# Patient Record
Sex: Female | Born: 1955 | Race: White | Hispanic: No | Marital: Married | Smoking: Never smoker
Health system: Southern US, Community
[De-identification: ages and names within clinical notes are randomized; demographics above are authoritative.]

## PROBLEM LIST (undated history)

## (undated) DIAGNOSIS — I82409 Acute embolism and thrombosis of unspecified deep veins of unspecified lower extremity: Secondary | ICD-10-CM

## (undated) DIAGNOSIS — M1990 Unspecified osteoarthritis, unspecified site: Secondary | ICD-10-CM

## (undated) HISTORY — DX: Acute embolism and thrombosis of unspecified deep veins of unspecified lower extremity: I82.409

## (undated) HISTORY — PX: ABDOMINAL HYSTERECTOMY: SHX81

## (undated) HISTORY — PX: PATENT DUCTUS ARTERIOUS REPAIR: SHX269

## (undated) HISTORY — PX: BREAST EXCISIONAL BIOPSY: SUR124

---
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
## 2016-03-28 DIAGNOSIS — R030 Elevated blood-pressure reading, without diagnosis of hypertension: Secondary | ICD-10-CM | POA: Insufficient documentation

## 2016-07-10 DIAGNOSIS — Z8639 Personal history of other endocrine, nutritional and metabolic disease: Secondary | ICD-10-CM | POA: Insufficient documentation

## 2017-08-08 DIAGNOSIS — C4491 Basal cell carcinoma of skin, unspecified: Secondary | ICD-10-CM | POA: Insufficient documentation

## 2017-08-08 DIAGNOSIS — C4492 Squamous cell carcinoma of skin, unspecified: Secondary | ICD-10-CM | POA: Insufficient documentation

## 2017-11-19 DIAGNOSIS — N951 Menopausal and female climacteric states: Secondary | ICD-10-CM | POA: Insufficient documentation

## 2019-05-25 DIAGNOSIS — K635 Polyp of colon: Secondary | ICD-10-CM | POA: Insufficient documentation

## 2021-05-18 DIAGNOSIS — Z0000 Encounter for general adult medical examination without abnormal findings: Secondary | ICD-10-CM | POA: Insufficient documentation

## 2021-07-07 ENCOUNTER — Ambulatory Visit: Payer: Medicare HMO | Attending: Orthopedic Surgery | Admitting: Occupational Therapy

## 2021-07-07 ENCOUNTER — Other Ambulatory Visit: Payer: Self-pay

## 2021-07-07 DIAGNOSIS — M25631 Stiffness of right wrist, not elsewhere classified: Secondary | ICD-10-CM | POA: Diagnosis present

## 2021-07-07 DIAGNOSIS — M25531 Pain in right wrist: Secondary | ICD-10-CM | POA: Diagnosis present

## 2021-07-07 DIAGNOSIS — M654 Radial styloid tenosynovitis [de Quervain]: Secondary | ICD-10-CM | POA: Diagnosis not present

## 2021-07-07 NOTE — Therapy (Signed)
[place] [facility] [address] [place], [address] Phone: [phone]   Fax:  [phone]  Occupational Therapy Evaluation  Patient Details  Name: Gail Roberts MRN: [id number] Date of Birth: 1956/07/03 Referring Provider (OT): Tamala Julian   Encounter Date: 07/07/2021   OT End of Session - 07/07/21 1035     Visit Number 1    Number of Visits 12    Date for OT Re-Evaluation 08/18/21    OT Start Time 0833    OT Stop Time 0910    OT Time Calculation (min) 37 min    Activity Tolerance Patient tolerated treatment well    Behavior During Therapy [facility] for tasks assessed/performed             No past medical history on file.   There were no vitals filed for this visit.   Subjective Assessment - 07/07/21 1012     Subjective  It got the worse the last year again - when my husband had TKR and I tied his shoes and caught pushed down on my thumb - ibruprofen and splint helps - but temporarily - it is good day today - splint started wearing 75% of time since last week    Pertinent History Seen by PA at [facility] - on 06/27/21- Gail Roberts is a 65 y.o. female that presents to clinic today for initial evaluation and management of right wrist pain.     Her pain began around a year ago. She states she did slip forward on her hands when rising after tieing her husband's shoe. The pain is located primarily over the radial aspect of the wrist, though she states that it sometimes feels "like there is a band" around the wrist. She describes her pain as achy. It is aggravated by ulnar deviation. She has minimal pain at rest. She reports associated She denies associated numbness, tingling, weakness in the hands. She has tried Ibuprofen and aspirin with significant but temporary relief of her symptoms.    REfer to OT    Patient Stated Goals Want the pain better that I can do things without pain and my husband not to  help me so much    Currently in Pain? Yes    Pain Score 3     Pain Location Wrist    Pain Orientation Right    Pain Descriptors / Indicators Aching;Tender;Tightness    Pain Type Acute pain;Chronic pain    Pain Onset More than a month ago    Pain Frequency Intermittent               [facility] OT Assessment - 07/07/21 0001       Assessment   Medical Diagnosis R DeQuervain    Referring Provider (OT) Tamala Julian    Onset Date/Surgical Date 10/08/20    Hand Dominance Right      Home  Environment   Lives With Spouse      Prior Function   Vocation Retired    Teacher, music, on [employer],      AROM   Right Wrist Extension 65 Degrees    Right Wrist Flexion 85 Degrees    Right Wrist Radial Deviation 15 Degrees    Right Wrist Ulnar Deviation 40 Degrees      Strength   Right Hand Grip (lbs) 62    Right Hand Lateral Pinch 19 lbs    Right Hand 3 Point Pinch 17 lbs  Left Hand Grip (lbs) 62    Left Hand Lateral Pinch 17 lbs    Left Hand 3 Point Pinch 13 lbs      Right Hand AROM   R Thumb Opposition to Index --   Opposition to base of 5th                             OT Education - 07/07/21 1035     Education Details Findings of eval and HEP    Person(s) Educated Patient    Methods Explanation;Demonstration;Tactile cues;Verbal cues    Comprehension Verbal cues required;Returned demonstration;Verbalized understanding                 OT Long Term Goals - 07/07/21 1235       OT LONG TERM GOAL #1   Title Pt's R wrist and thumb AROM increase to WNL without increase symptoms to wean out of splint    Baseline pain and tender 2/10 , pain with UD, thumb RA and end range flexion, ext decrease    Time 4    Period Weeks    Status New    Target Date 08/03/21      OT LONG TERM GOAL #2   Title Pt to be independent in HEP including know what activities and tasks to modify  to decrease pain and prevent agrivation of symptoms    Baseline no  knowledge - cook, computer , grandkids, crochet and sewing    Time 6    Period Weeks    Status New    Target Date 08/18/21                   Plan - 07/07/21 1038     Clinical Impression Statement Pt present at OT eval with diagnosis of R DeQuervain tenosynovitis- long standing on and off for longer than year  - pain and tenderness about 2/10 over distal radius and FInkelstein - did decrease the last week since wearing thumb spica splint and taking ibuprofen- pain with UD and thumb RA -  pt grip and prehension strength WNL for her age -ed pt on HEP and modalities as well as joint protection and modifications to tasks doing her ADL's and IADL's    OT Occupational Profile and History Problem Focused Assessment - Including review of records relating to presenting problem    Occupational performance deficits (Please refer to evaluation for details): ADL's;IADL's;Play;Leisure;Social Participation    Body Structure / Function / Physical Skills ADL;Decreased knowledge of use of DME;Strength;Pain;UE functional use;IADL;ROM;Flexibility;Decreased knowledge of precautions    Rehab Potential Good    Clinical Decision Making Limited treatment options, no task modification necessary    Comorbidities Affecting Occupational Performance: None    Modification or Assistance to Complete Evaluation  No modification of tasks or assist necessary to complete eval    OT Frequency 2x / week    OT Duration 6 weeks    OT Treatment/Interventions Self-care/ADL training;Iontophoresis;Fluidtherapy;Contrast Bath;Therapeutic exercise;Manual Therapy;Patient/family education;Passive range of motion;DME and/or AE instruction;Splinting    Consulted and Agree with Plan of Care Patient             Patient will benefit from skilled therapeutic intervention in order to improve the following deficits and impairments:   Body Structure / Function / Physical Skills: ADL, Decreased knowledge of use of DME, Strength, Pain, UE  functional use, IADL, ROM, Flexibility, Decreased knowledge of precautions  Visit Diagnosis: Tenosynovitis, de Quervain - Plan: Ot plan of care cert/re-cert  Pain in right wrist - Plan: Ot plan of care cert/re-cert  Stiffness of right wrist, not elsewhere classified - Plan: Ot plan of care cert/re-cert    Problem List There are no problems to display for this patient.   Rosalyn Gess, OTR/L,CLT 07/07/2021, 12:39 PM  [place] [facility] [address], [place], [address] Phone: [phone]   Fax:  [phone]  Name: Tirsa Gail MRN: [id number] Date of Birth: [birth year]

## 2021-07-18 ENCOUNTER — Ambulatory Visit: Payer: Medicare HMO | Attending: Orthopedic Surgery | Admitting: Occupational Therapy

## 2021-07-18 DIAGNOSIS — M25631 Stiffness of right wrist, not elsewhere classified: Secondary | ICD-10-CM | POA: Insufficient documentation

## 2021-07-18 DIAGNOSIS — M654 Radial styloid tenosynovitis [de Quervain]: Secondary | ICD-10-CM | POA: Diagnosis not present

## 2021-07-18 DIAGNOSIS — M25531 Pain in right wrist: Secondary | ICD-10-CM | POA: Insufficient documentation

## 2021-07-18 NOTE — Therapy (Signed)
[place] [facility] [address], [place], [address] Phone: [phone]   Fax:  [phone]  Occupational Therapy Treatment  Patient Details  Name: Gail Roberts MRN: [id number] Date of Birth: Aug 21, 1956 Referring Provider (OT): Tamala Julian   Encounter Date: 07/18/2021   OT End of Session - 07/18/21 1106     Visit Number 2    Number of Visits 12    Date for OT Re-Evaluation 08/18/21    OT Start Time 9323    OT Stop Time 1055    OT Time Calculation (min) 16 min    Activity Tolerance Patient tolerated treatment well    Behavior During Therapy [facility] for tasks assessed/performed             No past medical history on file.    There were no vitals filed for this visit.   Subjective Assessment - 07/18/21 1104     Subjective  Doing very well - did my exercises like you told me and wear the splint most all the time until the other day took if off 1/2 the day and yesterday most of the day off-and pain better - and motion -and I am changing the way I grip and push/ pull objects -    Pertinent History Seen by PA at [facility] - on 06/27/21- Gail Roberts is a 65 y.o. female that presents to clinic today for initial evaluation and management of right wrist pain.     Her pain began around a year ago. She states she did slip forward on her hands when rising after tieing her husband's shoe. The pain is located primarily over the radial aspect of the wrist, though she states that it sometimes feels "like there is a band" around the wrist. She describes her pain as achy. It is aggravated by ulnar deviation. She has minimal pain at rest. She reports associated She denies associated numbness, tingling, weakness in the hands. She has tried Ibuprofen and aspirin with significant but temporary relief of her symptoms.    REfer to OT    Patient Stated Goals Want the pain better that I can do things without pain and my  husband not to help me so much    Currently in Pain? No/denies                [facility] OT Assessment - 07/18/21 0001       AROM   Right Wrist Extension 70 Degrees    Right Wrist Flexion 85 Degrees      Strength   Right Hand Grip (lbs) 67    Left Hand Grip (lbs) 62                Pt progress very well - AROM WNL and no pain - or with resistance- 5/5 in all planes  Same with thumb AROM in all planes - no pain  Increase grip  No pain with Finkelstein and Tenderness over distal radius head  Pt to wean out of splint gradually and cont with modification with tasks that cause pain or symptoms              OT Education - 07/18/21 1105     Education Details progress, HEP and modifications to cont with    Person(s) Educated Patient    Methods Explanation;Demonstration;Tactile cues;Verbal cues    Comprehension Verbal cues required;Returned demonstration;Verbalized understanding  OT Long Term Goals - 07/18/21 1147       OT LONG TERM GOAL #1   Title Pt's R wrist and thumb AROM increase to WNL without increase symptoms to wean out of splint    Baseline pain and tender 2/10 , pain with UD, thumb RA and end range flexion, ext decrease - NOW no pain and no symptoms - wean out of splint    Status Achieved      OT LONG TERM GOAL #2   Title Pt to be independent in HEP including know what activities and tasks to modify  to decrease pain and prevent agrivation of symptoms    Baseline no knowledge - cook, computer , grandkids, crochet and sewing  - NOW k ow  knowledge on modifictaions to prevent symptoms    Status Achieved                   Plan - 07/18/21 1106     Clinical Impression Statement Pt present at OT eval with diagnosis of R DeQuervain tenosynovitis- long standing on and off for longer than year  - pain and tenderness about 2/10 over distal radius and FInkelstein - did decrease the last week since wearing thumb spica splint and  taking ibuprofen- pain with UD and thumb RA -  pt grip and prehension strength WNL for her age - She was ed on HEP , and malities as well as joint protection and modifications to tasks doing her ADL's and IADL's- pt arrvie this date with no pain , negative Finkelstein and no pain with wrist and thumb AROM and strength end range- grip increase to R 67lbs ,and L 62 - pt report paying attention how she do things and can wean gradually out of splint and cont wiht ROM as needed- pt can cont at home with homeprogram    OT Occupational Profile and History Problem Focused Assessment - Including review of records relating to presenting problem    Occupational performance deficits (Please refer to evaluation for details): ADL's;IADL's;Play;Leisure;Social Participation    Body Structure / Function / Physical Skills ADL;Decreased knowledge of use of DME;Strength;Pain;UE functional use;IADL;ROM;Flexibility;Decreased knowledge of precautions    Rehab Potential Good    Clinical Decision Making Limited treatment options, no task modification necessary    Comorbidities Affecting Occupational Performance: None    Modification or Assistance to Complete Evaluation  No modification of tasks or assist necessary to complete eval    OT Frequency --   as need follow up   OT Duration 4 weeks    OT Treatment/Interventions Self-care/ADL training;Iontophoresis;Fluidtherapy;Contrast Bath;Therapeutic exercise;Manual Therapy;Patient/family education;Passive range of motion;DME and/or AE instruction;Splinting    Consulted and Agree with Plan of Care Patient             Patient will benefit from skilled therapeutic intervention in order to improve the following deficits and impairments:   Body Structure / Function / Physical Skills: ADL, Decreased knowledge of use of DME, Strength, Pain, UE functional use, IADL, ROM, Flexibility, Decreased knowledge of precautions       Visit Diagnosis: Tenosynovitis, de Quervain  Pain in  right wrist  Stiffness of right wrist, not elsewhere classified    Problem List There are no problems to display for this patient.   Gail Roberts, OTR/L,CLT 07/18/2021, 11:49 AM  [place] [facility] [address], [place], [address] Phone: [phone]   Fax:  [phone]  Name: Gail Roberts MRN: [id number]  Date of Birth: 1956/10/01

## 2022-01-09 ENCOUNTER — Encounter: Payer: Self-pay | Admitting: Otolaryngology

## 2022-02-07 ENCOUNTER — Other Ambulatory Visit: Payer: Self-pay | Admitting: Podiatry

## 2022-02-08 ENCOUNTER — Encounter: Payer: Self-pay | Admitting: Podiatry

## 2022-02-08 ENCOUNTER — Other Ambulatory Visit: Payer: Self-pay

## 2022-02-08 NOTE — Discharge Instructions (Addendum)
[place] ?[place] ? ?POST OPERATIVE INSTRUCTIONS FOR DR. Vickki Muff AND DR. Destany Severns ?[place] ? ? ?Take your medication as prescribed.  Pain medication should be taken only as needed.  You may also supplement pain medication with Tylenol as needed. ? ?Keep the dressing clean, dry and intact.  Please for the most part keep the boot on at all times as it will be acting as a cast or splint to support your foot. ? ?Keep your foot elevated above the heart level for the first 48 hours.  Continue elevation thereafter to improve swelling.  You may also use ice to the top of your right foot for maximum 10 minutes out of every 1 hour as needed. ? ?Recommend staying for the most part nonweightbearing to the right lower extremity, use crutches, knee scooter, or wheelchair to get around.  You may use your heel for contact when standing but do not put pressure on the ball of your foot. ? ?Do not take a shower. Baths are permissible as long as the foot is kept out of the water.  ? ?Every hour you are awake:  ?Bend your knee 15 times. ?Massage calf 15 times ? ?Call [facility] [phone]) if any of the following problems occur: ?You develop a temperature or fever. ?The bandage becomes saturated with blood. ?Medication does not stop your pain. ?Injury of the foot occurs. ?Any symptoms of infection including redness, odor, or red streaks running from wound.  ?

## 2022-02-15 ENCOUNTER — Encounter
Admission: RE | Disposition: A | Discharge status: Home / Self Care | Payer: Self-pay | Source: Home / Self Care | Attending: Podiatry

## 2022-02-15 ENCOUNTER — Ambulatory Visit
Admission: RE | Admit: 2022-02-15 | Discharge: 2022-02-15 | Disposition: A | Discharge status: Home / Self Care | Payer: Medicare HMO | Attending: Podiatry | Admitting: Podiatry

## 2022-02-15 ENCOUNTER — Ambulatory Visit: Payer: Medicare HMO | Admitting: Anesthesiology

## 2022-02-15 ENCOUNTER — Encounter: Payer: Self-pay | Admitting: Podiatry

## 2022-02-15 ENCOUNTER — Ambulatory Visit: Payer: Self-pay

## 2022-02-15 ENCOUNTER — Other Ambulatory Visit: Payer: Self-pay

## 2022-02-15 DIAGNOSIS — M2021 Hallux rigidus, right foot: Secondary | ICD-10-CM | POA: Diagnosis present

## 2022-02-15 HISTORY — PX: ARTHRODESIS METATARSALPHALANGEAL JOINT (MTPJ): SHX6566

## 2022-02-15 HISTORY — DX: Unspecified osteoarthritis, unspecified site: M19.90

## 2022-02-15 SURGERY — FUSION, JOINT, GREAT TOE
Anesthesia: General | Site: Foot | Laterality: Right

## 2022-02-15 MED ORDER — AMOXICILLIN-POT CLAVULANATE 875-125 MG PO TABS: 1.0000 | ORAL_TABLET | Freq: Two times a day (BID) | ORAL | 0 refills | Status: AC

## 2022-02-15 MED ORDER — CEFAZOLIN SODIUM-DEXTROSE 2-4 GM/100ML-% IV SOLN
2.0000 g | INTRAVENOUS | Status: AC
  Administered 2022-02-15: 2 g via INTRAVENOUS

## 2022-02-15 MED ORDER — HYDROCODONE-ACETAMINOPHEN 7.5-325 MG PO TABS: 1.0000 | ORAL_TABLET | Freq: Four times a day (QID) | ORAL | 0 refills | Status: AC | PRN

## 2022-02-15 MED ORDER — PROPOFOL 500 MG/50ML IV EMUL
INTRAVENOUS | Status: DC | PRN
  Administered 2022-02-15: 100 ug/kg/min via INTRAVENOUS

## 2022-02-15 MED ORDER — GLYCOPYRROLATE 0.2 MG/ML IJ SOLN
INTRAMUSCULAR | Status: DC | PRN
Start: 2022-02-15 — End: 2022-02-15
  Administered 2022-02-15 (×2): .1 mg via INTRAVENOUS

## 2022-02-15 MED ORDER — LIDOCAINE HCL (CARDIAC) PF 100 MG/5ML IV SOSY
PREFILLED_SYRINGE | INTRAVENOUS | Status: DC | PRN
  Administered 2022-02-15: 40 mg via INTRAVENOUS

## 2022-02-15 MED ORDER — 0.9 % SODIUM CHLORIDE (POUR BTL) OPTIME
TOPICAL | Status: DC | PRN
  Administered 2022-02-15: 500 mL

## 2022-02-15 MED ORDER — BUPIVACAINE HCL (PF) 0.5 % IJ SOLN
INTRAMUSCULAR | Status: DC | PRN
  Administered 2022-02-15: 100 mg

## 2022-02-15 MED ORDER — FENTANYL CITRATE (PF) 100 MCG/2ML IJ SOLN
INTRAMUSCULAR | Status: DC | PRN
  Administered 2022-02-15: 100 ug via INTRAVENOUS

## 2022-02-15 MED ORDER — LACTATED RINGERS IV SOLN: INTRAVENOUS | Status: DC

## 2022-02-15 MED ORDER — BUPIVACAINE LIPOSOME 1.3 % IJ SUSP
INTRAMUSCULAR | Status: DC | PRN
  Administered 2022-02-15: 20 mL

## 2022-02-15 MED ORDER — EPHEDRINE SULFATE (PRESSORS) 50 MG/ML IJ SOLN
INTRAMUSCULAR | Status: DC | PRN
  Administered 2022-02-15: 10 mg via INTRAVENOUS
  Administered 2022-02-15 (×2): 5 mg via INTRAVENOUS

## 2022-02-15 MED ORDER — MIDAZOLAM HCL 2 MG/2ML IJ SOLN
INTRAMUSCULAR | Status: DC | PRN
  Administered 2022-02-15: 2 mg via INTRAVENOUS

## 2022-02-15 MED ORDER — PHENYLEPHRINE HCL (PRESSORS) 10 MG/ML IV SOLN
INTRAVENOUS | Status: DC | PRN
Start: 2022-02-15 — End: 2022-02-15
  Administered 2022-02-15 (×2): 100 ug via INTRAVENOUS

## 2022-02-15 MED ORDER — ONDANSETRON HCL 4 MG/2ML IJ SOLN
INTRAMUSCULAR | Status: DC | PRN
Start: 2022-02-15 — End: 2022-02-15
  Administered 2022-02-15: 4 mg via INTRAVENOUS

## 2022-02-15 MED ORDER — ASPIRIN EC 81 MG PO TBEC: 81.0000 mg | DELAYED_RELEASE_TABLET | Freq: Two times a day (BID) | ORAL | 0 refills | Status: DC

## 2022-02-15 MED ORDER — OXYCODONE HCL 5 MG PO TABS: 5.0000 mg | ORAL_TABLET | Freq: Once | ORAL | Status: DC | PRN

## 2022-02-15 MED ORDER — OXYCODONE HCL 5 MG/5ML PO SOLN: 5.0000 mg | Freq: Once | ORAL | Status: DC | PRN

## 2022-02-15 MED ORDER — DEXAMETHASONE SODIUM PHOSPHATE 4 MG/ML IJ SOLN
INTRAMUSCULAR | Status: DC | PRN
  Administered 2022-02-15: 4 mg via INTRAVENOUS

## 2022-02-15 MED ORDER — FENTANYL CITRATE PF 50 MCG/ML IJ SOSY: 25.0000 ug | PREFILLED_SYRINGE | INTRAMUSCULAR | Status: DC | PRN

## 2022-02-15 SURGICAL SUPPLY — 59 items
BIT DRILL 2 FENESTRATED (MISCELLANEOUS) IMPLANT
BIT DRILL CANNULTD 2.6 X 130MM (DRILL) IMPLANT
BIT DRILL SOLID 2.0 X 110MM (DRILL) IMPLANT
BIT DRILLL 2 FENESTRATED (MISCELLANEOUS) ×1
BLADE OSC/SAGITTAL MD 9X18.5 (BLADE) ×1 IMPLANT
BNDG CMPR STD VLCR NS LF 5.8X4 (GAUZE/BANDAGES/DRESSINGS) ×1
BNDG CMPR STD VLCR NS LF 5.8X6 (GAUZE/BANDAGES/DRESSINGS) ×1
BNDG COHESIVE 4X5 TAN ST LF (GAUZE/BANDAGES/DRESSINGS) ×2 IMPLANT
BNDG ELASTIC 4X5.8 VLCR NS LF (GAUZE/BANDAGES/DRESSINGS) ×2 IMPLANT
BNDG ELASTIC 6X5.8 VLCR NS LF (GAUZE/BANDAGES/DRESSINGS) ×2 IMPLANT
BNDG ESMARK 4X12 TAN STRL LF (GAUZE/BANDAGES/DRESSINGS) ×2 IMPLANT
BNDG GAUZE ELAST 4 BULKY (GAUZE/BANDAGES/DRESSINGS) ×2 IMPLANT
BOOT STEPPER DURA LG (SOFTGOODS) ×1 IMPLANT
CANISTER SUCT 1200ML W/VALVE (MISCELLANEOUS) ×2 IMPLANT
COUNTERSICK 4.0 HEADED (MISCELLANEOUS) ×2
COVER LIGHT HANDLE UNIVERSAL (MISCELLANEOUS) ×4 IMPLANT
CUFF TOURN SGL QUICK 18X4 (TOURNIQUET CUFF) ×1 IMPLANT
DRAPE FLUOR MINI C-ARM 54X84 (DRAPES) ×2 IMPLANT
DRILL CANNULATED 2.6 X 130MM (DRILL) ×2
DRILL SOLID 2.0 X 110MM (DRILL) ×2
DURAPREP 26ML APPLICATOR (WOUND CARE) ×2 IMPLANT
ELECT REM PT RETURN 9FT ADLT (ELECTROSURGICAL) ×2
ELECTRODE REM PT RTRN 9FT ADLT (ELECTROSURGICAL) ×1 IMPLANT
GAUZE SPONGE 4X4 12PLY STRL (GAUZE/BANDAGES/DRESSINGS) ×2 IMPLANT
GAUZE XEROFORM 1X8 LF (GAUZE/BANDAGES/DRESSINGS) ×2 IMPLANT
GLOVE SURG POLYISO LF SZ7 (GLOVE) ×4 IMPLANT
GLOVE SURG UNDER POLY LF SZ7 (GLOVE) ×2 IMPLANT
GOWN STRL REUS W/ TWL LRG LVL3 (GOWN DISPOSABLE) ×2 IMPLANT
GOWN STRL REUS W/TWL LRG LVL3 (GOWN DISPOSABLE) ×4
K-WIRE SMOOTH 1.6X150MM (WIRE) ×2
K-WIRE SNGL END 1.2X150 (MISCELLANEOUS) ×4
KIT TURNOVER KIT A (KITS) ×2 IMPLANT
KWIRE SMOOTH 1.6X150MM (WIRE) IMPLANT
KWIRE SNGL END 1.2X150 (MISCELLANEOUS) IMPLANT
NS IRRIG 500ML POUR BTL (IV SOLUTION) ×2 IMPLANT
PACK EXTREMITY ARMC (MISCELLANEOUS) ×2 IMPLANT
PADDING CAST BLEND 4X4 NS (MISCELLANEOUS) ×6 IMPLANT
PLATE MTP 0DEG RIGHT (Plate) ×1 IMPLANT
REAMER FEMALE 23 (MISCELLANEOUS) IMPLANT
REAMER FEMALE 23MM (MISCELLANEOUS) ×2
REAMER MALE 23 (MISCELLANEOUS) IMPLANT
REAMER MALE 23MM (MISCELLANEOUS) ×2
REAMER SLEEVE 23 (SLEEVE) ×1 IMPLANT
SCREW 4.0 X 26 ST (Screw) ×1 IMPLANT
SCREW COUNTERSINK 4.0 HEADED (MISCELLANEOUS) IMPLANT
SCREW LOCK PLATE R3 2.7X12 (Screw) ×1 IMPLANT
SCREW LOCK PLATE R3 2.7X14 (Screw) ×2 IMPLANT
SCREW LOCK PLATE R3 2.7X16 (Screw) ×2 IMPLANT
SCREW NON LOCKING 2.7X18 (Screw) ×1 IMPLANT
SPLINT CAST 1 STEP 4X30 (MISCELLANEOUS) ×2 IMPLANT
STOCKINETTE IMPERVIOUS LG (DRAPES) ×2 IMPLANT
SUT MNCRL 4-0 (SUTURE) ×2
SUT MNCRL 4-0 27XMFL (SUTURE) ×1
SUT VIC AB 3-0 SH 27 (SUTURE) ×2
SUT VIC AB 3-0 SH 27X BRD (SUTURE) IMPLANT
SUT VIC AB 4-0 SH 27 (SUTURE) ×2
SUT VIC AB 4-0 SH 27XANBCTRL (SUTURE) IMPLANT
SUTURE MNCRL 4-0 27XMF (SUTURE) IMPLANT
WIRE OLIVE SMOOTH 1.4MMX60MM (WIRE) ×2 IMPLANT

## 2022-02-15 NOTE — Anesthesia Procedure Notes (Signed)
Anesthesia Regional Block: Adductor canal block  ? ?Pre-Anesthetic Checklist: , timeout performed,  Correct Patient, Correct Site, Correct Laterality,  Correct Procedure, Correct Position, site marked,  Risks and benefits discussed,  Surgical consent,  Pre-op evaluation,  At surgeon's request and post-op pain management ? ?Laterality: Right ? ?Prep: chloraprep     ?  ?Needles:  ?Injection technique: Single-shot ? ?Needle Type: Echogenic Needle   ? ? ?Needle Length: 9cm  ?Needle Gauge: 21  ? ? ? ?Additional Needles: ? ? ?Procedures:,,,, ultrasound used (permanent image in chart),,    ?Narrative:  ?Start time: 02/15/2022 9:02 AM ?End time: 02/15/2022 9:07 AM ?Injection made incrementally with aspirations every 5 mL. ? ?Performed by: Personally  ? ?Additional Notes: ?Functioning IV was confirmed and monitors applied. Ultrasound guidance: relevant anatomy identified, needle position confirmed, local anesthetic spread visualized around nerve(s)., vascular puncture avoided.  Image printed for medical record.  Negative aspiration and no paresthesias; incremental administration of local anesthetic. The patient tolerated the procedure well. Vitals signes recorded in RN notes. ? ? ? ?

## 2022-02-15 NOTE — Progress Notes (Signed)
Assisted Beola Cord, ANMD with left, popliteal/saphenous, ultrasound guided block. Side rails up, monitors on throughout procedure. See vital signs in flow sheet. Tolerated Procedure well. ?

## 2022-02-15 NOTE — Anesthesia Procedure Notes (Signed)
Anesthesia Regional Block: Popliteal block  ? ?Pre-Anesthetic Checklist: , timeout performed,  Correct Patient, Correct Site, Correct Laterality,  Correct Procedure, Correct Position, site marked,  Risks and benefits discussed,  Surgical consent,  Pre-op evaluation,  At surgeon's request and post-op pain management ? ?Laterality: Right ? ?Prep: chloraprep     ?  ?Needles:  ?Injection technique: Single-shot ? ?Needle Type: Echogenic Needle   ? ? ?Needle Length: 9cm  ?Needle Gauge: 21  ? ? ? ?Additional Needles: ? ? ?Procedures:,,,, ultrasound used (permanent image in chart),,    ?Narrative:  ?Start time: 02/15/2022 6:55 AM ?End time: 02/15/2022 7:00 AM ?Injection made incrementally with aspirations every 5 mL. ? ?Performed by: Personally  ?Anesthesiologist: Marice Potter, MD ? ?Additional Notes: ?Functioning IV was confirmed and monitors applied. Ultrasound guidance: relevant anatomy identified, needle position confirmed, local anesthetic spread visualized around nerve(s)., vascular puncture avoided.  Image printed for medical record.  Negative aspiration and no paresthesias; incremental administration of local anesthetic. The patient tolerated the procedure well. Vitals signes recorded in RN notes. ? ? ? ?

## 2022-02-15 NOTE — Transfer of Care (Signed)
Immediate Anesthesia Transfer of Care Note ? ?Patient: Gail Roberts ? ?Procedure(s) Performed: ARTHRODESIS METATARSALPHALANGEAL JOINT (MTPJ) - FIRST (Right: Foot) ? ?Patient Location: PACU ? ?Anesthesia Type: General ? ?Level of Consciousness: awake, alert  and patient cooperative ? ?Airway and Oxygen Therapy: Patient Spontanous Breathing and Patient connected to supplemental oxygen ? ?Post-op Assessment: Post-op Vital signs reviewed, Patient's Cardiovascular Status Stable, Respiratory Function Stable, Patent Airway and No signs of Nausea or vomiting ? ?Post-op Vital Signs: Reviewed and stable ? ?Complications: No notable events documented. ? ?

## 2022-02-15 NOTE — Anesthesia Preprocedure Evaluation (Signed)
Anesthesia Evaluation  ?Patient identified by MRN, date of birth, ID band ?Patient awake ? ? ? ?Reviewed: ?Allergy & Precautions, H&P , NPO status , Patient's Chart, lab work & pertinent test results, reviewed documented beta blocker date and time  ? ?Airway ?Mallampati: II ? ?TM Distance: >3 FB ?Neck ROM: full ? ? ? Dental ?no notable dental hx. ? ?  ?Pulmonary ?neg pulmonary ROS,  ?  ?Pulmonary exam normal ?breath sounds clear to auscultation ? ? ? ? ? ? Cardiovascular ?Exercise Tolerance: Good ?negative cardio ROS ? ? ?Rhythm:regular Rate:Normal ? ? ?  ?Neuro/Psych ?negative neurological ROS ? negative psych ROS  ? GI/Hepatic ?negative GI ROS, Neg liver ROS,   ?Endo/Other  ?negative endocrine ROS ? Renal/GU ?negative Renal ROS  ?negative genitourinary ?  ?Musculoskeletal ? ? Abdominal ?  ?Peds ? Hematology ?negative hematology ROS ?(+)   ?Anesthesia Other Findings ? ? Reproductive/Obstetrics ?negative OB ROS ? ?  ? ? ? ? ? ? ? ? ? ? ? ? ? ?  ?  ? ? ? ? ? ? ? ? ?Anesthesia Physical ?Anesthesia Plan ? ?ASA: 2 ? ?Anesthesia Plan: General  ? ?Post-op Pain Management: Regional block  ? ?Induction:  ? ?PONV Risk Score and Plan: 3 and Propofol infusion, Treatment may vary due to age or medical condition, Ondansetron and Midazolam ? ?Airway Management Planned:  ? ?Additional Equipment:  ? ?Intra-op Plan:  ? ?Post-operative Plan:  ? ?Informed Consent: I have reviewed the patients History and Physical, chart, labs and discussed the procedure including the risks, benefits and alternatives for the proposed anesthesia with the patient or authorized representative who has indicated his/her understanding and acceptance.  ? ? ? ?Dental Advisory Given ? ?Plan Discussed with: CRNA ? ?Anesthesia Plan Comments:   ? ? ? ? ? ? ?Anesthesia Quick Evaluation ? ?

## 2022-02-15 NOTE — H&P (Signed)
HISTORY AND PHYSICAL INTERVAL NOTE: ? ?02/15/2022 ? ?7:14 AM ? 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  The various methods of treatment have been discussed with the patient.  No guarantees were given.  After consideration of risks, benefits and other options for treatment, the patient has consented to surgery.  I have reviewed the patients? chart and labs.   ? ?PROCEDURE: ?RIGHT 1ST MTPJ ARTHRODESIS ? ?A history and physical examination was performed in my office.  The patient was reexamined.  There have been no changes to this history and physical examination. ? ?Caroline More, DPM ? ?

## 2022-02-15 NOTE — Anesthesia Postprocedure Evaluation (Signed)
Anesthesia Post Note ? ?Patient: Gail Roberts ? ?Procedure(s) Performed: ARTHRODESIS METATARSALPHALANGEAL JOINT (MTPJ) - FIRST (Right: Foot) ? ? ?  ?Patient location during evaluation: PACU ?Anesthesia Type: General ?Level of consciousness: awake and alert ?Pain management: pain level controlled ?Vital Signs Assessment: post-procedure vital signs reviewed and stable ?Respiratory status: spontaneous breathing, nonlabored ventilation, respiratory function stable and patient connected to nasal cannula oxygen ?Cardiovascular status: blood pressure returned to baseline and stable ?Postop Assessment: no apparent nausea or vomiting ?Anesthetic complications: no ? ? ?No notable events documented. ? ?Ranay Ketter ELAINE ? ? ? ? ? ?

## 2022-02-15 NOTE — Op Note (Signed)
PODIATRY / FOOT AND ANKLE SURGERY OPERATIVE REPORT ? ? ? ?SURGEON: Caroline More, DPM ? ?PRE-OPERATIVE DIAGNOSIS:  ?Right hallux rigidus ? ?POST-OPERATIVE DIAGNOSIS: Same ? ?PROCEDURE(S): ?Right first metatarsal phalangeal joint fusion ? ?HEMOSTASIS: Right ankle tourniquet ? ?ANESTHESIA: general ? ?ESTIMATED BLOOD LOSS: 10 cc ? ?FINDING(S): ?1.  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 ? ?PATHOLOGY/SPECIMEN(S): None ? ?INDICATIONS:   ?Gail Roberts is a 66 y.o. female who presents with pain to the first metatarsal phalangeal joint of the right foot.  Patient had x-ray imaging taken which did show substantial arthritis to the area.  Patient notes that shoes that touch the bump on the top of the big toe area cause pain discomfort and she notes pain with motion.  She notes that she has very limited motion to the joint overall that causes her pain discomfort with ambulation.  All treatment options were discussed with the patient both conservative and surgical attempts at correction clean potential risks and complications at this time patient is elected for surgical procedure consisting of right first metatarsal phalangeal joint fusion.. ? ?DESCRIPTION: ?After obtaining full informed written consent, the patient was brought back to the operating room and placed supine upon the operating table.  The patient received IV antibiotics prior to induction.  A preoperative popliteal/saphenous nerve block was performed by anesthesia.  After obtaining adequate anesthesia, the patient was prepped and draped in the standard fashion.  An Esmarch bandage was used to exsanguinate the right lower extremity and the pneumatic thigh tourniquet was inflated. ? ?Attention was directed to the right first metatarsal phalangeal joint area where a linear longitudinal incision was made along the contour deformity medial to the tendon of the extensor hallucis longus.  The  incision was deepened to the subcutaneous tissues utilizing sharp and blunt dissection care was taken to identify and retract all vital neurovascular structures and all venous contributories were cauterized as necessary.  A capsular and periosteal incision was made medial to the tendon of the extensor hallucis longus at the first metatarsal phalangeal joint level and involve the contour deformity.  The capsular and periosteal tissue was reflected medially laterally thereby exposing the first metatarsal phalangeal joint at the operative site.  A large joint mass was resected and passed off the operative site at the dorsal aspect of the joint.  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  The medial eminence was resected and passed off in the operative site.  A McClamary elevator was used to free up any adhesions to the plantar capsule and capsular areas of the joint.  At this time a sagittal bone saw was used to resect some bony exostosis present at the medial, dorsal, and lateral aspects of the joint.  All the joint irregularities were removed with a rongeur.  At this time the guidewire for the reamer was then placed into the first metatarsal head centrally and the remaining articular cartilage was resected and reamed off to a healthy subchondral bone area.  The wire was removed and a rongeur was used to clean up any bony debris to the area.  The same thing was then performed to the proximal phalanx bases wire was placed in the central aspect and reamer was used to ream the remaining residual cartilage to healthy subchondral bleeding bone.  A rongeur was used  to resect some of the bony debris to the area.  A flush was performed with copious amounts normal sterile saline.  A 2 oh fenestrating drill bit was then used to fenestrate the joint surface. ? ?The toe was  then held in a rectus position and temporary fixation was then obtained with the wire going through the lateral aspect of the base of the proximal phalanx of the hallux across the joint and into the first metatarsal head.  This was done in such way to keep the big toe in a rectus position such that the pulp of the toe touched the ground with simulated weightbearing in the big toe did not touch the second toe.  This appeared to be well achieved clinically and radiographically under fluoroscopic guidance.  At this time the plate for the first metatarsal phalangeal joint fusion was placed from [employer] 28 and temporary fixated with all of wires.  The attachment was then applied for the lag screw.  The lag screw was then placed utilizing standard AO principles and techniques with a 3.5 x 26 mm headed cannulated screw.  Excellent compression was noted and as the compression was applied as the land of the screw engaged the proximal phalanx the olive wires were removed as well as the temporary fixation to achieve compression across the joint.  Excellent compression was noted.  The olive wires were then placed back into the plate well over the first metatarsal phalangeal joint and C-arm imaging was used to verify correct position which appeared to be excellent overall.  At this time 3 distal locking screws were placed utilizing standard AO principles and techniques and 2 proximal locking screws were placed.  One proximal nonlocking screw was placed through the compression slot with the appropriate orientation.  All screws appeared to be of the appropriate size and length under fluoroscopic guidance.  There appeared to be excellent compression across the first metatarsal phalangeal joint area and the big toe appeared to sit in a rectus position such that did not touch the second toe and the pulp of the toe touched the floor with simulated weightbearing.  The surgical site was flushed with copious amounts normal sterile  saline.  Some of the residual excess capsule was resected and passed off the operative site as well as a small skin wedge that was taken out due to the redundancy of the skin due to the large dorsal exostosis present.  The surgical site was once again flushed with copious amounts normal sterile saline.  The capsular and periosteal tissue was reapproximated well coapted with 3-0 Vicryl.  The subcutaneous tissue was reapproximated well coapted with 4-0 Vicryl.  The skin was then reapproximated well coapted in a subcuticular stitch with 4-0 Monocryl.  Mastisol and Steri-Strips were then applied.  A postoperative dressing was then applied consisting of Xeroform, 4 x 4 gauze, Kerlix, Ace wrap, cam boot.  The pneumatic ankle tourniquet was deflated prior to placing the postoperative dressing and a prompt hyperemic response was noted to all digits of the right foot. ? ?The patient tolerated the procedure and anesthesia well and was transferred to recovery room vital signs stable vascular status intact all toes the right foot.  Following.  Postoperative monitoring the patient be discharged with the appropriate orders, instructions, and medications.  Patient is to remain for the most part nonweightbearing to the right lower extremity with heel contact only.  Patient to keep dressings clean, dry, and intact until next visit.  Patient should also be  keeping the boot on is much as possible as it will be acting as a cast or splint. ? ?COMPLICATIONS: None ? ?CONDITION: Good, stable ? ?Caroline More, DPM ? ?

## 2022-02-19 ENCOUNTER — Encounter: Payer: Self-pay | Admitting: Podiatry

## 2022-03-14 ENCOUNTER — Ambulatory Visit
Admission: RE | Admit: 2022-03-14 | Discharge: 2022-03-14 | Disposition: A | Discharge status: Home / Self Care | Payer: Medicare HMO | Source: Ambulatory Visit | Attending: Podiatry | Admitting: Podiatry

## 2022-03-14 ENCOUNTER — Other Ambulatory Visit: Payer: Self-pay | Admitting: Podiatry

## 2022-03-14 ENCOUNTER — Inpatient Hospital Stay
Admission: EM | Admit: 2022-03-14 | Discharge: 2022-03-17 | DRG: 272 | Disposition: A | Discharge status: Home / Self Care | Payer: Medicare HMO | Attending: Family Medicine | Admitting: Family Medicine

## 2022-03-14 ENCOUNTER — Other Ambulatory Visit: Payer: Self-pay

## 2022-03-14 DIAGNOSIS — Z7901 Long term (current) use of anticoagulants: Secondary | ICD-10-CM

## 2022-03-14 DIAGNOSIS — I82421 Acute embolism and thrombosis of right iliac vein: Secondary | ICD-10-CM | POA: Diagnosis not present

## 2022-03-14 DIAGNOSIS — I824Y1 Acute embolism and thrombosis of unspecified deep veins of right proximal lower extremity: Secondary | ICD-10-CM | POA: Diagnosis not present

## 2022-03-14 DIAGNOSIS — I82431 Acute embolism and thrombosis of right popliteal vein: Principal | ICD-10-CM | POA: Diagnosis present

## 2022-03-14 DIAGNOSIS — R030 Elevated blood-pressure reading, without diagnosis of hypertension: Secondary | ICD-10-CM | POA: Diagnosis present

## 2022-03-14 DIAGNOSIS — E559 Vitamin D deficiency, unspecified: Secondary | ICD-10-CM | POA: Diagnosis present

## 2022-03-14 DIAGNOSIS — I82401 Acute embolism and thrombosis of unspecified deep veins of right lower extremity: Principal | ICD-10-CM

## 2022-03-14 DIAGNOSIS — Z8249 Family history of ischemic heart disease and other diseases of the circulatory system: Secondary | ICD-10-CM

## 2022-03-14 DIAGNOSIS — M7989 Other specified soft tissue disorders: Secondary | ICD-10-CM

## 2022-03-14 DIAGNOSIS — I82409 Acute embolism and thrombosis of unspecified deep veins of unspecified lower extremity: Secondary | ICD-10-CM | POA: Diagnosis present

## 2022-03-14 DIAGNOSIS — I82411 Acute embolism and thrombosis of right femoral vein: Secondary | ICD-10-CM | POA: Diagnosis not present

## 2022-03-14 DIAGNOSIS — Z825 Family history of asthma and other chronic lower respiratory diseases: Secondary | ICD-10-CM

## 2022-03-14 DIAGNOSIS — Z90710 Acquired absence of both cervix and uterus: Secondary | ICD-10-CM

## 2022-03-14 LAB — CBC WITH DIFFERENTIAL/PLATELET
Abs Immature Granulocytes: 0.03 10*3/uL (ref 0.00–0.07)
Basophils Absolute: 0 10*3/uL (ref 0.0–0.1)
Basophils Relative: 1 %
Eosinophils Absolute: 0.2 10*3/uL (ref 0.0–0.5)
Eosinophils Relative: 4 %
HCT: 38.2 % (ref 36.0–46.0)
Hemoglobin: 12.2 g/dL (ref 12.0–15.0)
Immature Granulocytes: 1 %
Lymphocytes Relative: 24 %
Lymphs Abs: 1.3 10*3/uL (ref 0.7–4.0)
MCH: 31.2 pg (ref 26.0–34.0)
MCHC: 31.9 g/dL (ref 30.0–36.0)
MCV: 97.7 fL (ref 80.0–100.0)
Monocytes Absolute: 0.5 10*3/uL (ref 0.1–1.0)
Monocytes Relative: 9 %
Neutro Abs: 3.4 10*3/uL (ref 1.7–7.7)
Neutrophils Relative %: 61 %
Platelets: 253 10*3/uL (ref 150–400)
RBC: 3.91 MIL/uL (ref 3.87–5.11)
RDW: 12.9 % (ref 11.5–15.5)
WBC: 5.5 10*3/uL (ref 4.0–10.5)
nRBC: 0 % (ref 0.0–0.2)

## 2022-03-14 LAB — BASIC METABOLIC PANEL
Anion gap: 7 (ref 5–15)
BUN: 11 mg/dL (ref 8–23)
CO2: 28 mmol/L (ref 22–32)
Calcium: 9 mg/dL (ref 8.9–10.3)
Chloride: 107 mmol/L (ref 98–111)
Creatinine, Ser: 0.67 mg/dL (ref 0.44–1.00)
GFR, Estimated: >60 mL/min (ref >60)
Glucose, Bld: 118 mg/dL — ABNORMAL HIGH (ref 70–99)
Potassium: 4.3 mmol/L (ref 3.5–5.1)
Sodium: 142 mmol/L (ref 135–145)

## 2022-03-14 LAB — PROTIME-INR
INR: 1 (ref 0.8–1.2)
Prothrombin Time: 13.2 seconds (ref 11.4–15.2)

## 2022-03-14 LAB — BRAIN NATRIURETIC PEPTIDE: B Natriuretic Peptide: 46.1 pg/mL (ref 0.0–100.0)

## 2022-03-14 LAB — TROPONIN I (HIGH SENSITIVITY): Troponin I (High Sensitivity): 3 ng/L (ref <18)

## 2022-03-14 LAB — APTT: aPTT: 29 seconds (ref 24–36)

## 2022-03-14 MED ORDER — SODIUM CHLORIDE 0.9% FLUSH
3.0000 mL | Freq: Two times a day (BID) | INTRAVENOUS | Status: DC
  Administered 2022-03-15 – 2022-03-17 (×3): 3 mL via INTRAVENOUS

## 2022-03-14 MED ORDER — HEPARIN (PORCINE) 25000 UT/250ML-% IV SOLN
1400.0000 [IU]/h | INTRAVENOUS | Status: DC
  Administered 2022-03-14 – 2022-03-16 (×3): 1400 [IU]/h via INTRAVENOUS
  Filled 2022-03-14 (×3): qty 250

## 2022-03-14 MED ORDER — ACETAMINOPHEN 325 MG PO TABS
650.0000 mg | ORAL_TABLET | Freq: Four times a day (QID) | ORAL | Status: DC | PRN
Start: 2022-03-14 — End: 2022-03-17

## 2022-03-14 MED ORDER — HYDRALAZINE HCL 20 MG/ML IJ SOLN
20.0000 mg | Freq: Four times a day (QID) | INTRAMUSCULAR | Status: DC | PRN
Start: 2022-03-14 — End: 2022-03-17
  Filled 2022-03-14: qty 1

## 2022-03-14 MED ORDER — SODIUM CHLORIDE 0.9 % IV SOLN
20.0000 mL/h | INTRAVENOUS | Status: AC
  Administered 2022-03-15 – 2022-03-16 (×2): 20 mL/h via INTRAVENOUS

## 2022-03-14 MED ORDER — MORPHINE SULFATE (PF) 2 MG/ML IV SOLN: 2.0000 mg | INTRAVENOUS | Status: DC | PRN

## 2022-03-14 MED ORDER — HEPARIN BOLUS VIA INFUSION
5000.0000 [IU] | Freq: Once | INTRAVENOUS | Status: AC
  Administered 2022-03-14: 5000 [IU] via INTRAVENOUS
  Filled 2022-03-14: qty 5000

## 2022-03-14 MED ORDER — ACETAMINOPHEN 650 MG RE SUPP: 650.0000 mg | Freq: Four times a day (QID) | RECTAL | Status: DC | PRN

## 2022-03-14 MED ORDER — HYDROCODONE-ACETAMINOPHEN 5-325 MG PO TABS: 1.0000 | ORAL_TABLET | ORAL | Status: DC | PRN

## 2022-03-14 NOTE — Progress Notes (Addendum)
[facility] for heparin infusion initiation and monitoring Indication: DVT  Not on File  Patient Measurements:   Heparin Dosing Weight: 92.5 kg  Vital Signs: Temp: 98.6 F (37 C) (06/07 1607) Temp Source: Oral (06/07 1607) BP: 171/103 (06/07 1958) Pulse Rate: 79 (06/07 1958)  Labs: No results for input(s): HGB, HCT, PLT, APTT, LABPROT, INR, HEPARINUNFRC, HEPRLOWMOCWT, CREATININE, CKTOTAL, CKMB, TROPONINIHS in the last 72 hours.  CrCl cannot be calculated (No successful lab value found.).   Medical History: Past Medical History:  Diagnosis Date   Arthritis    toe    Assessment: 39 yof with c/o week of right leg swelling. venous US shows acute appearing occlusive thrombus extending from the calf veins through the visualized femoral vein. A review of dispense history reveals no chronic anticoagulation.  Goal of Therapy:  Heparin level 0.3-0.7 units/ml Monitor platelets by anticoagulation protocol: Yes   Plan:  Give 5000 units bolus x 1 Start heparin infusion at 1400 units/hr Check anti-Xa level in 6 hours and daily while on heparin Continue to monitor H&H and platelets  Dallie Piles 03/14/2022,8:00 PM

## 2022-03-14 NOTE — ED Provider Notes (Signed)
[facility] Provider Note    Event Date/Time   First MD Initiated Contact with Patient 03/14/22 1935     (approximate)   History   DVT   HPI  Gail Roberts is a 66 y.o. female  who, per podiatry note had surgery on 02/15/22 who presents to the emergency department today because of concern for right leg swelling and outpatient [place] which showed occlusive thrombus.  The patient has been in a boot to the right foot for the past 4 weeks after the surgery.  For the past week she has noticed swelling to her right leg.  It is accompanied by some discomfort although no significant pain.  The patient denies any chest pain or shortness of breath. No history of blood clots.     Physical Exam   Triage Vital Signs: ED Triage Vitals  Enc Vitals Group     BP 03/14/22 1607 (!) 158/95     Pulse Rate 03/14/22 1607 91     Resp 03/14/22 1607 18     Temp 03/14/22 1607 98.6 F (37 C)     Temp Source 03/14/22 1607 Oral     SpO2 03/14/22 1607 97 %     Weight --      Height --      Head Circumference --      Peak Flow --      Pain Score 03/14/22 1608 0                Most recent vital signs: Vitals:   03/14/22 1607  BP: (!) 158/95  Pulse: 91  Resp: 18  Temp: 98.6 F (37 C)  SpO2: 97%    General: Awake, alert and oriented. CV:  Good peripheral perfusion. Regular rate and rhythm. Resp:  Normal effort. Lungs clear. Abd:  No distention.  Other:  Swelling to right lower extremity.    ED Results / Procedures / Treatments   Labs (all labs ordered are listed, but only abnormal results are displayed) Labs Reviewed  BASIC METABOLIC PANEL - Abnormal; Notable for the following components:      Result Value   Glucose, Bld 118 (*)    All other components within normal limits  CBC WITH DIFFERENTIAL/PLATELET  PROTIME-INR  APTT  HEPARIN LEVEL (UNFRACTIONATED)  CBC     EKG  None   RADIOLOGY [place] ordered as outpatient  reviewed   PROCEDURES:  Critical Care performed: No  Procedures   MEDICATIONS ORDERED IN ED: Medications - No data to display   IMPRESSION / MDM / [place] / ED COURSE  I reviewed the triage vital signs and the nursing notes.                              Differential diagnosis includes, but is not limited to, DVT, cellulitis.  Patient's presentation is most consistent with acute presentation with potential threat to life or bodily function.  Patient presented to the emergency department today after outpatient ultrasound revealed a large right-sided DVT.  On exam patient does have swelling to that right leg.  Discussed with Dr. Lucky Cowboy with vascular surgery.  At this time he recommended heparin and admission for possible thrombectomy.  He did not think CT scan was necessary at this time.  Discussed with patient and family. Discussed with Dr. Posey Pronto with the hospitalist service who will plan on admission.   FINAL CLINICAL IMPRESSION(S) / ED DIAGNOSES  Final diagnoses:  Acute deep vein thrombosis (DVT) of right lower extremity, unspecified vein (HCC)      Note:  This document was prepared using Dragon voice recognition software and may include unintentional dictation errors.    Nance Pear, MD 03/14/22 2056

## 2022-03-14 NOTE — ED Notes (Signed)
Pt provided with a blanket under her head.

## 2022-03-14 NOTE — Assessment & Plan Note (Signed)
Provoked DVT , extensive DVT in right leg swelling since past few days.  Cont heparin drip per pharmacy protocol.  Thrombectomy in am and vascular has been consulted. Npo after midnight.  TNI/ BNP negative/ Blood pressure 139/83, pulse 75, temperature 98.4 F (36.9 C), temperature source Oral, resp. rate 17, height 6' (1.829 m), weight 95.3 kg, SpO2 99 %. we will admit pt to stepdown or tele.

## 2022-03-14 NOTE — H&P (Signed)
History and Physical    Patient: Gail Roberts DTO:[id number] DOB: 27-Dec-1955 DOA: 03/14/2022 DOS: the patient was seen and examined on 03/14/2022 PCP: Derald Macleod, PA-C  Patient coming from: Home  Chief Complaint:  Chief Complaint  Patient presents with   DVT   HPI: Gail Roberts is a 66 y.o. female with medical history significant of vit d def coming with right leg swelling since past few days getting worse. And now her entire rt leg is two times the left leg. It is difficult to bear weight on it,husband at bedside. Pt has no medical history, and does not take any meds. And is 4 weeks post op for rt ankle surgery.  No chest pain sob or fever.     Review of Systems: As mentioned in the history of present illness. All other systems reviewed and are negative. 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 She has never used smokeless tobacco. She reports that she does not currently use alcohol. She reports that she does not use drugs.  No Known Allergies  History reviewed. No pertinent family history.  Prior to Admission medications   Medication Sig Start Date End Date Taking? Authorizing Provider  acetaminophen (TYLENOL) 500 MG tablet Take 500 mg by mouth every 6 (six) hours as needed.   Yes [provider]  Cholecalciferol (VITAMIN D3) 50 MCG (2000 UT) capsule Take 2,000 Units by mouth daily.   Yes [provider]   Cranberry 250 MG TABS Take 1 tablet by mouth daily.   Yes [provider]  docusate sodium (COLACE) 100 MG capsule Take 100 mg by mouth daily.   Yes [provider]  vitamin C (ASCORBIC ACID) 500 MG tablet Take 500 mg by mouth daily.   Yes [provider]  aspirin EC 81 MG tablet Take 1 tablet (81 mg total) by mouth in the morning and at bedtime. Swallow whole. Patient not taking: Reported on 03/14/2022 02/16/22 03/30/22  Caroline More, DPM    Physical Exam: Vitals:   03/14/22 1607 03/14/22 1958 03/14/22 2007 03/14/22 2347  BP: (!) 158/95 (!) 171/103  139/83  Pulse: 91 79  75  Resp: '18 18  17  '$ Temp: 98.6 F (37 C)   98.4 F (36.9 C)  TempSrc: Oral   Oral  SpO2: 97% 99%  99%  Weight:   95.3 kg   Height:   6' (1.829 m)    Physical Exam Vitals and nursing note reviewed.  Constitutional:      General: She is not in acute distress.    Appearance: Normal appearance. She is not ill-appearing, toxic-appearing or diaphoretic.  HENT:     Head: Normocephalic and atraumatic.     Right Ear: Hearing and external ear normal.     Left Ear: Hearing and external ear normal.     Nose: Nose normal. No nasal deformity.     Mouth/Throat:  Lips: Pink.     Mouth: Mucous membranes are moist.     Tongue: No lesions.     Pharynx: Oropharynx is clear.  Eyes:     Extraocular Movements: Extraocular movements intact.     Pupils: Pupils are equal, round, and reactive to light.  Neck:     Vascular: No carotid bruit.  Cardiovascular:     Rate and Rhythm: Normal rate and regular rhythm.     Pulses:          Dorsalis pedis pulses are 2+ on the right side and 2+ on the left side.       Posterior tibial pulses are 2+ on the right side and 2+ on the left side.     Heart sounds: Murmur heard.  Systolic murmur is present with a grade of 1/6.  Pulmonary:     Effort: Pulmonary effort is normal.     Breath sounds: Normal breath sounds.  Abdominal:     General: Bowel sounds are  normal. There is no distension.     Palpations: Abdomen is soft. There is no mass.     Tenderness: There is no abdominal tenderness. There is no guarding.     Hernia: No hernia is present.  Musculoskeletal:        General: Swelling present.     Right lower leg: Edema present.     Left lower leg: No edema.       Legs:  Skin:    General: Skin is warm.  Neurological:     General: No focal deficit present.     Mental Status: She is alert and oriented to person, place, and time.     Cranial Nerves: Cranial nerves 2-12 are intact.     Motor: Motor function is intact.  Psychiatric:        Attention and Perception: Attention normal.        Mood and Affect: Mood normal.        Speech: Speech normal.        Behavior: Behavior normal. Behavior is cooperative.        Cognition and Memory: Cognition normal.    Data Reviewed: Results for orders placed or performed during the hospital encounter of 03/14/22 (from the past 24 hour(s))  CBC with Differential     Status: None   Collection Time: 03/14/22  7:59 PM  Result Value Ref Range   WBC 5.5 4.0 - 10.5 K/uL   RBC 3.91 3.87 - 5.11 MIL/uL   Hemoglobin 12.2 12.0 - 15.0 g/dL   HCT 38.2 36.0 - 46.0 %   MCV 97.7 80.0 - 100.0 fL   MCH 31.2 26.0 - 34.0 pg   MCHC 31.9 30.0 - 36.0 g/dL   RDW 12.9 11.5 - 15.5 %   Platelets 253 150 - 400 K/uL   nRBC 0.0 0.0 - 0.2 %   Neutrophils Relative % 61 %   Neutro Abs 3.4 1.7 - 7.7 K/uL   Lymphocytes Relative 24 %   Lymphs Abs 1.3 0.7 - 4.0 K/uL   Monocytes Relative 9 %   Monocytes Absolute 0.5 0.1 - 1.0 K/uL   Eosinophils Relative 4 %   Eosinophils Absolute 0.2 0.0 - 0.5 K/uL   Basophils Relative 1 %   Basophils Absolute 0.0 0.0 - 0.1 K/uL   Immature Granulocytes 1 %   Abs Immature Granulocytes 0.03 0.00 - 0.07 K/uL  Basic metabolic panel     Status: Abnormal   Collection Time: 03/14/22  7:59 PM  Result Value Ref Range   Sodium 142 135 - 145 mmol/L   Potassium 4.3 3.5 - 5.1 mmol/L   Chloride 107  98 - 111 mmol/L   CO2 28 22 - 32 mmol/L   Glucose, Bld 118 (H) 70 - 99 mg/dL   BUN 11 8 - 23 mg/dL   Creatinine, Ser 0.67 0.44 - 1.00 mg/dL   Calcium 9.0 8.9 - 10.3 mg/dL   GFR, Estimated >60 >60 mL/min   Anion gap 7 5 - 15  Protime-INR     Status: None   Collection Time: 03/14/22  7:59 PM  Result Value Ref Range   Prothrombin Time 13.2 11.4 - 15.2 seconds   INR 1.0 0.8 - 1.2  APTT     Status: None   Collection Time: 03/14/22  7:59 PM  Result Value Ref Range   aPTT 29 24 - 36 seconds  Troponin I (High Sensitivity)     Status: None   Collection Time: 03/14/22  7:59 PM  Result Value Ref Range   Troponin I (High Sensitivity) 3 <18 ng/L  Brain natriuretic peptide     Status: None   Collection Time: 03/14/22  7:59 PM  Result Value Ref Range   B Natriuretic Peptide 46.1 0.0 - 100.0 pg/mL     Assessment and Plan: * DVT (deep venous thrombosis) (HCC) Provoked DVT , extensive DVT in right leg swelling since past few days.  Cont heparin drip per pharmacy protocol.  Thrombectomy in am and vascular has been consulted. Npo after midnight.  TNI/ BNP negative/ Blood pressure 139/83, pulse 75, temperature 98.4 F (36.9 C), temperature source Oral, resp. rate 17, height 6' (1.829 m), weight 95.3 kg, SpO2 99 %. we will admit pt to stepdown or tele.    Elevated blood-pressure reading without diagnosis of hypertension EKG pending. Treat as needed with manual rechecks.      Advance Care Planning:   Code Status: Full Code   Consults:  -Vascular MD: Dr.Dew.  Family Communication:  Guilbert,john (Spouse)  [phone] ([place])  Severity of Illness: The appropriate patient status for this patient is INPATIENT. Inpatient status is judged to be reasonable and necessary in order to provide the required intensity of service to ensure the patient's safety. The patient's presenting symptoms, physical exam findings, and initial radiographic and laboratory data in the context of their chronic  comorbidities is felt to place them at high risk for further clinical deterioration. Furthermore, it is not anticipated that the patient will be medically stable for discharge from the hospital within 2 midnights of admission.   * I certify that at the point of admission it is my clinical judgment that the patient will require inpatient hospital care spanning beyond 2 midnights from the point of admission due to high intensity of service, high risk for further deterioration and high frequency of surveillance required.*  Author: Para Skeans, MD 03/14/2022 11:56 PM  For on call review [email or web address].

## 2022-03-14 NOTE — Assessment & Plan Note (Signed)
EKG pending. Treat as needed with manual rechecks.

## 2022-03-14 NOTE — Hospital Course (Addendum)
Ed signout: Had a rt foot sx 4 weeks ago,. Rt boot. Week ago had swelling and now has a large occlusive thrombus.  Per edmd  Case d/w Dr.scheiner vascular DM on call. Ordered EKG fro any PE changes .  Troponin and BNP. Pt asymptomatic otherwise per EDMD. Will evaluate and admit.

## 2022-03-14 NOTE — ED Triage Notes (Signed)
Pt comes with c/o week of right leg swelling. Pt dx with DVT today. Pt sent here for further evaluation.

## 2022-03-15 ENCOUNTER — Observation Stay
Admit: 2022-03-15 | Discharge: 2022-03-15 | Disposition: A | Discharge status: Home / Self Care | Payer: Medicare HMO | Attending: Internal Medicine | Admitting: Internal Medicine

## 2022-03-15 DIAGNOSIS — I82431 Acute embolism and thrombosis of right popliteal vein: Secondary | ICD-10-CM | POA: Diagnosis present

## 2022-03-15 DIAGNOSIS — Z90710 Acquired absence of both cervix and uterus: Secondary | ICD-10-CM | POA: Diagnosis not present

## 2022-03-15 DIAGNOSIS — I824Y1 Acute embolism and thrombosis of unspecified deep veins of right proximal lower extremity: Secondary | ICD-10-CM

## 2022-03-15 DIAGNOSIS — I82411 Acute embolism and thrombosis of right femoral vein: Secondary | ICD-10-CM | POA: Diagnosis present

## 2022-03-15 DIAGNOSIS — R030 Elevated blood-pressure reading, without diagnosis of hypertension: Secondary | ICD-10-CM | POA: Diagnosis present

## 2022-03-15 DIAGNOSIS — Z825 Family history of asthma and other chronic lower respiratory diseases: Secondary | ICD-10-CM | POA: Diagnosis not present

## 2022-03-15 DIAGNOSIS — I82401 Acute embolism and thrombosis of unspecified deep veins of right lower extremity: Secondary | ICD-10-CM | POA: Diagnosis not present

## 2022-03-15 DIAGNOSIS — I82421 Acute embolism and thrombosis of right iliac vein: Secondary | ICD-10-CM | POA: Diagnosis not present

## 2022-03-15 DIAGNOSIS — Z7901 Long term (current) use of anticoagulants: Secondary | ICD-10-CM | POA: Diagnosis not present

## 2022-03-15 DIAGNOSIS — Z8249 Family history of ischemic heart disease and other diseases of the circulatory system: Secondary | ICD-10-CM | POA: Diagnosis not present

## 2022-03-15 DIAGNOSIS — E559 Vitamin D deficiency, unspecified: Secondary | ICD-10-CM | POA: Diagnosis present

## 2022-03-15 LAB — COMPREHENSIVE METABOLIC PANEL
ALT: 23 U/L (ref 0–44)
AST: 24 U/L (ref 15–41)
Albumin: 3.6 g/dL (ref 3.5–5.0)
Alkaline Phosphatase: 70 U/L (ref 38–126)
Anion gap: 5 (ref 5–15)
BUN: 11 mg/dL (ref 8–23)
CO2: 26 mmol/L (ref 22–32)
Calcium: 8.6 mg/dL — ABNORMAL LOW (ref 8.9–10.3)
Chloride: 111 mmol/L (ref 98–111)
Creatinine, Ser: 0.55 mg/dL (ref 0.44–1.00)
GFR, Estimated: >60 mL/min (ref >60)
Glucose, Bld: 106 mg/dL — ABNORMAL HIGH (ref 70–99)
Potassium: 3.8 mmol/L (ref 3.5–5.1)
Sodium: 142 mmol/L (ref 135–145)
Total Bilirubin: 0.4 mg/dL (ref 0.3–1.2)
Total Protein: 6.8 g/dL (ref 6.5–8.1)

## 2022-03-15 LAB — CBC
HCT: 36.1 % (ref 36.0–46.0)
Hemoglobin: 11.6 g/dL — ABNORMAL LOW (ref 12.0–15.0)
MCH: 31 pg (ref 26.0–34.0)
MCHC: 32.1 g/dL (ref 30.0–36.0)
MCV: 96.5 fL (ref 80.0–100.0)
Platelets: 233 10*3/uL (ref 150–400)
RBC: 3.74 MIL/uL — ABNORMAL LOW (ref 3.87–5.11)
RDW: 12.8 % (ref 11.5–15.5)
WBC: 4.4 10*3/uL (ref 4.0–10.5)
nRBC: 0 % (ref 0.0–0.2)

## 2022-03-15 LAB — ECHOCARDIOGRAM COMPLETE
AR max vel: 2.58 cm2
AV Area VTI: 2.6 cm2
AV Area mean vel: 2.49 cm2
AV Mean grad: 3 mmHg
AV Peak grad: 5.3 mmHg
Ao pk vel: 1.15 m/s
Area-P 1/2: 2.79 cm2
Height: 72 in
MV VTI: 3 cm2
S' Lateral: 2.8 cm
Weight: 3379.21 oz

## 2022-03-15 LAB — HEPARIN LEVEL (UNFRACTIONATED)
Heparin Unfractionated: 0.35 IU/mL (ref 0.30–0.70)
Heparin Unfractionated: 0.37 IU/mL (ref 0.30–0.70)

## 2022-03-15 LAB — HIV ANTIBODY (ROUTINE TESTING W REFLEX): HIV Screen 4th Generation wRfx: NONREACTIVE

## 2022-03-15 NOTE — Progress Notes (Signed)
PROGRESS NOTE    Gail Roberts  MEQ:[id number] DOB: [birth year] DOA: 03/14/2022 PCP: Derald Macleod, PA-C   Brief Narrative:  This 66 years old female with PMH significant for vitamin D deficiency presented to the ED with complaints of right leg swelling for last few days.  Patient reports having right foot surgery 4 weeks ago and since then she has been nonweightbearing.  Patient has noticed right leg swelling,  she thought it is probably postoperative swelling and will get better with time.  She denies any pain.  Patient denies any history of blood clots or not taking any hormone replacement therapy.  Work-up in the ED shows extensive DVT in the right calf veins extending into the right femoral vein.  Patient is started on IV heparin.  Vascular surgery was consulted.  Patient is scheduled to have thrombectomy in the morning.  Assessment & Plan:   Principal Problem:   DVT (deep venous thrombosis) (HCC) Active Problems:   Elevated blood-pressure reading without diagnosis of hypertension  DVT(deep venous thrombosis) Likely provoked DVT.  Patient presented with right leg swelling. She underwent right foot surgery 4 weeks ago remains nonweightbearing. Venous duplex confirms extensive DVT from right calf veins right femoral. Continue heparin drip per pharmacy protocol. Patient denies any leg pain. Vascular surgery consulted recommended thrombectomy in a.m.  Elevated blood pressure without diagnosis of hypertension: Blood pressure remains elevated, not on any blood pressure medications Echocardiogram completed,  report pending   DVT prophylaxis: heparin gtt Code Status: Full code. Family Communication: No family at bed side. Disposition Plan:   Status is: Inpatient Remains inpatient appropriate because:   Admitted for right extensive DVT requiring IV heparin.  Vascular surgery is consulted scheduled to have thrombectomy in the morning.   Consultants:  Vascular  surgery  Procedures: Scheduled thrombectomy Antimicrobials: None  Subjective: Patient was seen and examined at bedside.  Overnight events noted.   Patient denies any leg pain.  Her leg is swollen but she reports is getting better.  Objective: Vitals:   03/15/22 0037 03/15/22 0437 03/15/22 0438 03/15/22 0800  BP: 133/68 135/70  128/76  Pulse: 74 71  69  Resp: '19 17  18  '$ Temp: 97.8 F (36.6 C) 98.1 F (36.7 C)  98 F (36.7 C)  TempSrc:    Oral  SpO2: 99% 97%  98%  Weight:   95.8 kg   Height:        Intake/Output Summary (Last 24 hours) at 03/15/2022 1221 Last data filed at 03/15/2022 0918 Gross per 24 hour  Intake 196.3 ml  Output 2 ml  Net 194.3 ml   Filed Weights   03/14/22 2007 03/15/22 0027 03/15/22 0438  Weight: 95.3 kg 95.2 kg 95.8 kg    Examination:  General exam: Appears comfortable, not in any acute distress.  Respiratory system: CTA bilaterally, no wheezing, no crackles, normal respiratory effort. Cardiovascular system: S1-S2 heard, regular rate and rhythm, no murmur. Gastrointestinal system: Abdomen is soft, non tender, non distended, BS+ Central nervous system: Alert and oriented x 3. No focal neurological deficits. Extremities: Right leg is swollen, nontender, no ulcer or wound. Skin: No rashes, lesions or ulcers Psychiatry: Judgement and insight appear normal. Mood & affect appropriate.     Data Reviewed: I have personally reviewed following labs and imaging studies  CBC: Recent Labs  Lab 03/14/22 1959 03/15/22 0450  WBC 5.5 4.4  NEUTROABS 3.4  --   HGB 12.2 11.6*  HCT 38.2 36.1  MCV 97.7 96.5  PLT 253 774   Basic Metabolic Panel: Recent Labs  Lab 03/14/22 1959 03/15/22 0450  NA 142 142  K 4.3 3.8  CL 107 111  CO2 28 26  GLUCOSE 118* 106*  BUN 11 11  CREATININE 0.67 0.55  CALCIUM 9.0 8.6*   GFR: Estimated Creatinine Clearance: 91 mL/min (by C-G formula based on SCr of 0.55 mg/dL). Liver Function Tests: Recent Labs  Lab  03/15/22 0450  AST 24  ALT 23  ALKPHOS 70  BILITOT 0.4  PROT 6.8  ALBUMIN 3.6   No results for input(s): "LIPASE", "AMYLASE" in the last 168 hours. No results for input(s): "AMMONIA" in the last 168 hours. Coagulation Profile: Recent Labs  Lab 03/14/22 1959  INR 1.0   Cardiac Enzymes: No results for input(s): "CKTOTAL", "CKMB", "CKMBINDEX", "TROPONINI" in the last 168 hours. BNP (last 3 results) No results for input(s): "PROBNP" in the last 8760 hours. HbA1C: No results for input(s): "HGBA1C" in the last 72 hours. CBG: No results for input(s): "GLUCAP" in the last 168 hours. Lipid Profile: No results for input(s): "CHOL", "HDL", "LDLCALC", "TRIG", "CHOLHDL", "LDLDIRECT" in the last 72 hours. Thyroid Function Tests: No results for input(s): "TSH", "T4TOTAL", "FREET4", "T3FREE", "THYROIDAB" in the last 72 hours. Anemia Panel: No results for input(s): "VITAMINB12", "FOLATE", "FERRITIN", "TIBC", "IRON", "RETICCTPCT" in the last 72 hours. Sepsis Labs: No results for input(s): "PROCALCITON", "LATICACIDVEN" in the last 168 hours.  No results found for this or any previous visit (from the past 240 hour(s)).       Radiology Studies: US Venous Img Lower Unilateral Right (DVT)  Result Date: 03/14/2022 CLINICAL DATA:  66 year old female with right leg swelling. EXAM: RIGHT LOWER EXTREMITY VENOUS DOPPLER ULTRASOUND TECHNIQUE: Gray-scale sonography with graded compression, as well as color Doppler and duplex ultrasound were performed to evaluate the right lower extremity deep venous systems from the level of the common femoral vein and including the common femoral, femoral, profunda femoral, popliteal and calf veins including the posterior tibial, peroneal and gastrocnemius veins when visible. Spectral Doppler was utilized to evaluate flow at rest and with distal augmentation maneuvers in the common femoral, femoral and popliteal veins. The contralateral common femoral vein was also  evaluated for comparison. COMPARISON:  None Available. FINDINGS: RIGHT LOWER EXTREMITY Common Femoral Vein: Hypoechoic, expansile, noncompressible, occlusive thrombus throughout. Central Greater Saphenous Vein: Hypoechoic, expansile, noncompressible, occlusive thrombus throughout. Central Profunda Femoral Vein: Hypoechoic, expansile, noncompressible, occlusive thrombus throughout. Femoral Vein: Hypoechoic, expansile, noncompressible, occlusive thrombus throughout. Popliteal Vein: Hypoechoic, expansile, noncompressible, occlusive thrombus throughout. Calf Veins: Hypoechoic, expansile, noncompressible, occlusive thrombus throughout. Other Findings:  None. LEFT LOWER EXTREMITY Common Femoral Vein: No evidence of thrombus. Normal compressibility, respiratory phasicity and response to augmentation. IMPRESSION: Acute appearing occlusive thrombus extending from the calf veins through the visualized femoral vein. Extent of possible iliocaval thrombus burden is not evaluated on this study. Recommend CT the abdomen and pelvis for further characterization of possible iliocaval thrombus. These results were called by telephone at the time of interpretation on 03/14/2022 at 3:45 pm to provider Caroline More , who verbally acknowledged these results. Ruthann Cancer, MD [facility] Radiology Electronically Signed   By: Ruthann Cancer M.D.   On: 03/14/2022 15:54     Scheduled Meds:  sodium chloride flush  3 mL Intravenous Q12H   Continuous Infusions:  sodium chloride 20 mL/hr (03/15/22 0027)   heparin 1,400 Units/hr (03/15/22 1137)     LOS: 0 days    Time spent:  84 mins    Shawna Clamp, MD [facility]   If 7PM-7AM, please contact night-coverage

## 2022-03-15 NOTE — Progress Notes (Signed)
*  PRELIMINARY RESULTS* Echocardiogram 2D Echocardiogram has been performed.  Gail Roberts 03/15/2022, 10:15 AM

## 2022-03-15 NOTE — Progress Notes (Signed)
[place] for heparin infusion initiation and monitoring Indication: DVT  No Known Allergies  Patient Measurements: Height: 6' (182.9 cm) Weight: 95.8 kg (211 lb 3.2 oz) IBW/kg (Calculated) : 73.1 Heparin Dosing Weight: 92.5 kg  Vital Signs: Temp: 98.1 F (36.7 C) (06/08 0437) Temp Source: Oral (06/07 2347) BP: 135/70 (06/08 0437) Pulse Rate: 71 (06/08 0437)  Labs: Recent Labs    03/14/22 1959 03/15/22 0450  HGB 12.2 11.6*  HCT 38.2 36.1  PLT 253 233  APTT 29  --   LABPROT 13.2  --   INR 1.0  --   HEPARINUNFRC  --  0.35  CREATININE 0.67 0.55  TROPONINIHS 3  --     Estimated Creatinine Clearance: 91 mL/min (by C-G formula based on SCr of 0.55 mg/dL).   Medical History: Past Medical History:  Diagnosis Date   Arthritis    toe    Assessment: 22 yof with c/o week of right leg swelling. venous US shows acute appearing occlusive thrombus extending from the calf veins through the visualized femoral vein. A review of dispense history reveals no chronic anticoagulation.  Goal of Therapy:  Heparin level 0.3-0.7 units/ml Monitor platelets by anticoagulation protocol: Yes   Plan:  6/8: HL @ 0450 = 0.35, therapeutic X 1 Will continue pt on current rate and draw confirmation level in 6 hrs on 6/8 @ 1100.   Anam Bobby D 03/15/2022,5:41 AM

## 2022-03-15 NOTE — Progress Notes (Signed)
[place] for heparin infusion initiation and monitoring Indication: DVT  No Known Allergies  Patient Measurements: Height: 6' (182.9 cm) Weight: 95.8 kg (211 lb 3.2 oz) IBW/kg (Calculated) : 73.1 Heparin Dosing Weight: 92.5 kg  Vital Signs: Temp: 98 F (36.7 C) (06/08 0800) Temp Source: Oral (06/08 0800) BP: 128/76 (06/08 0800) Pulse Rate: 69 (06/08 0800)  Labs: Recent Labs    03/14/22 1959 03/15/22 0450 03/15/22 1148  HGB 12.2 11.6*  --   HCT 38.2 36.1  --   PLT 253 233  --   APTT 29  --   --   LABPROT 13.2  --   --   INR 1.0  --   --   HEPARINUNFRC  --  0.35 0.37  CREATININE 0.67 0.55  --   TROPONINIHS 3  --   --      Estimated Creatinine Clearance: 91 mL/min (by C-G formula based on SCr of 0.55 mg/dL).   Medical History: Past Medical History:  Diagnosis Date   Arthritis    toe    Assessment: 54 yof with c/o week of right leg swelling. venous US shows acute appearing occlusive thrombus extending from the calf veins through the visualized femoral vein. A review of dispense history reveals no chronic anticoagulation.   Date Time HL  Rate/comment 6/8  0450 0.35 1400 un/hr, therapeutic x1 6/8 1148 0.37 1400 un/hr, therapeutic x2  Goal of Therapy:  Heparin level 0.3-0.7 units/ml Monitor platelets by anticoagulation protocol: Yes   Plan:  Heparin level 0.35, therapeutic x 2 Continue heparin infusion at 1400 units/hr Monitor daily heparin level, CBC, s/s of bleed    Darnelle Bos, PharmD 03/15/2022,12:14 PM

## 2022-03-15 NOTE — Consult Note (Signed)
[facility] Vascular Consult Note  MRN : [id number]  Gail Roberts is a 66 y.o. ([birth year]) female who presents with chief complaint of  Chief Complaint  Patient presents with   DVT  .   Consulting Physician: Shawna Clamp, MD Reason for consult: DVT History of Present Illness: Gail Roberts is a 66 year old female with a previous medical history of right toe surgery on 02/15/2022.  Following the surgery the patient was nonweightbearing.  She was at her follow-up visit to determine her weightbearing status and it was noticed that her right lower extremity was significantly more swollen than her left.  The patient notes that the swelling began several days prior to this.  There was concern for a DVT and noninvasive study showed a DVT extending from the calf to the femoral veins.  Currently the patient denies any chest pain or shortness of breath.  Current Facility-Administered Medications  Medication Dose Route Frequency Provider Last Rate Last Admin   0.9 %  sodium chloride infusion  20 mL/hr Intravenous Continuous Para Skeans, MD 20 mL/hr at 03/15/22 0027 20 mL/hr at 03/15/22 4854   acetaminophen (TYLENOL) tablet 650 mg  650 mg Oral Q6H PRN Para Skeans, MD       Or   acetaminophen (TYLENOL) suppository 650 mg  650 mg Rectal Q6H PRN Para Skeans, MD       heparin ADULT infusion 100 units/mL (25000 units/277m)  1,400 Units/hr Intravenous Continuous PPara Skeans MD 14 mL/hr at 03/15/22 0313 1,400 Units/hr at 03/15/22 0313   hydrALAZINE (APRESOLINE) injection 20 mg  20 mg Intravenous Q6H PRN PPara Skeans MD       HYDROcodone-acetaminophen (NORCO/VICODIN) 5-325 MG per tablet 1 tablet  1 tablet Oral Q4H PRN PPara Skeans MD       morphine (PF) 2 MG/ML injection 2 mg  2 mg Intravenous Q4H PRN PPara Skeans MD       sodium chloride flush (NS) 0.9 % injection 3 mL  3 mL Intravenous Q12H PPara Skeans MD        Past Medical History:  Diagnosis Date    Arthritis    toe    Past Surgical History:  Procedure Laterality Date   ABDOMINAL HYSTERECTOMY     ARTHRODESIS METATARSALPHALANGEAL JOINT (MTPJ) Right 02/15/2022   Procedure: ARTHRODESIS METATARSALPHALANGEAL JOINT (MTPJ) - FIRST;  Surgeon: BCaroline More DPM;  [place] [facility]  Service: Podiatry;  Laterality: Right;  Anesthesia: Choice - pop/saph exparil   BREAST EXCISIONAL BIOPSY Right    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 No pertinent family history.  No Known Allergies   REVIEW OF SYSTEMS (Negative unless checked)  Constitutional: '[]'$ Weight loss  '[]'$ Fever  '[]'$ Chills Cardiac: '[]'$ Chest pain   '[]'$ Chest pressure   '[]'$ Palpitations   '[]'$ Shortness of breath when laying flat   '[]'$ Shortness of breath at rest   '[]'$ Shortness of breath with exertion. Vascular:  '[]'$ Pain in legs with walking   '[]'$ Pain in legs at rest   '[]'$ Pain in legs when laying flat   '[]'$ Claudication   '[]'$ Pain in feet when walking  '[]'$ Pain in feet at rest  '[]'$ Pain in feet  when laying flat   '[]'$ History of DVT   '[]'$ Phlebitis   '[x]'$ Swelling in legs   '[]'$ Varicose veins   '[]'$ Non-healing ulcers Pulmonary:   '[]'$ Uses home oxygen   '[]'$ Productive cough   '[]'$ Hemoptysis   '[]'$ Wheeze  '[]'$ COPD   '[]'$ Asthma Neurologic:  '[]'$ Dizziness  '[]'$ Blackouts   '[]'$ Seizures   '[]'$ History of stroke   '[]'$ History of TIA  '[]'$ Aphasia   '[]'$ Temporary blindness   '[]'$ Dysphagia   '[]'$ Weakness or numbness in arms   '[]'$ Weakness or numbness in legs Musculoskeletal:  '[]'$ Arthritis   '[]'$ Joint swelling   '[]'$ Joint pain   '[]'$ Low back pain Hematologic:  '[]'$ Easy bruising  '[]'$ Easy bleeding   '[]'$ Hypercoagulable state   '[]'$ Anemic  '[]'$ Hepatitis Gastrointestinal:  '[]'$ Blood in stool   '[]'$ Vomiting blood  '[]'$ Gastroesophageal reflux/heartburn   '[]'$ Difficulty  swallowing. Genitourinary:  '[]'$ Chronic kidney disease   '[]'$ Difficult urination  '[]'$ Frequent urination  '[]'$ Burning with urination   '[]'$ Blood in urine Skin:  '[]'$ Rashes   '[]'$ Ulcers   '[]'$ Wounds Psychological:  '[]'$ History of anxiety   '[]'$  History of major depression.  Physical Examination  Vitals:   03/15/22 0037 03/15/22 0437 03/15/22 0438 03/15/22 0800  BP: 133/68 135/70  128/76  Pulse: 74 71  69  Resp: '19 17  18  '$ Temp: 97.8 F (36.6 C) 98.1 F (36.7 C)  98 F (36.7 C)  TempSrc:    Oral  SpO2: 99% 97%  98%  Weight:   95.8 kg   Height:       Body mass index is 28.64 kg/m. Gen:  WD/WN, NAD Head: [place]/AT, No temporalis wasting. Prominent temp pulse not noted. Ear/Nose/Throat: Hearing grossly intact, nares w/o erythema or drainage, oropharynx w/o Erythema/Exudate Eyes: Sclera non-icteric, conjunctiva clear Neck: Trachea midline.  No JVD.  Pulmonary:  Good air movement, respirations not labored, equal bilaterally.  Cardiac: RRR, normal S1, S2. Vascular: 3+ edema right lower extremity with none in the left Vessel Right Left  PT Palpable Palpable  DP Palpable Palpable   Musculoskeletal: M/S 5/5 throughout.  Extremities without ischemic changes.  No deformity or atrophy.  Neurologic: Sensation grossly intact in extremities.  Symmetrical.  Speech is fluent. Motor exam as listed above. Psychiatric: Judgment intact, Mood & affect appropriate for pt's clinical situation. Dermatologic: No rashes or ulcers noted.  No cellulitis or open wounds. Lymph : No Cervical, Axillary, or Inguinal lymphadenopathy.    CBC Lab Results  Component Value Date   WBC 4.4 03/15/2022   HGB 11.6 (L) 03/15/2022   HCT 36.1 03/15/2022   MCV 96.5 03/15/2022   PLT 233 03/15/2022    BMET    Component Value Date/Time   NA 142 03/15/2022 0450   K 3.8 03/15/2022 0450   CL 111 03/15/2022 0450   CO2 26 03/15/2022 0450   GLUCOSE 106 (H) 03/15/2022 0450   BUN 11 03/15/2022 0450   CREATININE 0.55 03/15/2022 0450    CALCIUM 8.6 (L) 03/15/2022 0450   GFRNONAA >60 03/15/2022 0450   Estimated Creatinine Clearance: 91 mL/min (by C-G formula based on SCr of 0.55 mg/dL).  COAG Lab Results  Component Value Date   INR 1.0 03/14/2022    Radiology US Venous Img Lower Unilateral Right (DVT)  Result Date: 03/14/2022 CLINICAL DATA:  66 year old female with right leg swelling. EXAM: RIGHT LOWER EXTREMITY VENOUS DOPPLER ULTRASOUND TECHNIQUE: Gray-scale sonography with graded compression, as well as color Doppler and duplex ultrasound were performed to evaluate the right lower extremity deep venous systems from the level of the common femoral vein and including the common femoral,  femoral, profunda femoral, popliteal and calf veins including the posterior tibial, peroneal and gastrocnemius veins when visible. Spectral Doppler was utilized to evaluate flow at rest and with distal augmentation maneuvers in the common femoral, femoral and popliteal veins. The contralateral common femoral vein was also evaluated for comparison. COMPARISON:  None Available. FINDINGS: RIGHT LOWER EXTREMITY Common Femoral Vein: Hypoechoic, expansile, noncompressible, occlusive thrombus throughout. Central Greater Saphenous Vein: Hypoechoic, expansile, noncompressible, occlusive thrombus throughout. Central Profunda Femoral Vein: Hypoechoic, expansile, noncompressible, occlusive thrombus throughout. Femoral Vein: Hypoechoic, expansile, noncompressible, occlusive thrombus throughout. Popliteal Vein: Hypoechoic, expansile, noncompressible, occlusive thrombus throughout. Calf Veins: Hypoechoic, expansile, noncompressible, occlusive thrombus throughout. Other Findings:  None. LEFT LOWER EXTREMITY Common Femoral Vein: No evidence of thrombus. Normal compressibility, respiratory phasicity and response to augmentation. IMPRESSION: Acute appearing occlusive thrombus extending from the calf veins through the visualized femoral vein. Extent of possible iliocaval  thrombus burden is not evaluated on this study. Recommend CT the abdomen and pelvis for further characterization of possible iliocaval thrombus. These results were called by telephone at the time of interpretation on 03/14/2022 at 3:45 pm to provider Caroline More , who verbally acknowledged these results. Ruthann Cancer, MD [facility] Radiology Electronically Signed   By: Ruthann Cancer M.D.   On: 03/14/2022 15:54   DG MINI C-ARM IMAGE ONLY  Result Date: 02/15/2022 There is no interpretation for this exam.  This order is for images obtained during a surgical procedure.  Please See "Surgeries" Tab for more information regarding the procedure.      Assessment/Plan 1. Deep Vein Thrombosis   The patient has evidence of an extensive DVT extending from her calf veins to her femoral veins.  Based on discussion with the patient this has been ongoing for less than a week.  Because this happened following the patient's to surgery during her nonweightbearing status, this DVT is felt to be provoked.  The patient would likely benefit from a right lower extremity thrombectomy to help with resolution of DVT as well as to help facilitate healing and decrease likelihood of postphlebitic symptoms.  We have discussed the risk, benefits and alternatives.  The patient agrees to proceed with procedure.  We will plan on having the patient undergo thrombectomy on Friday.    Family Communication: Husband at [place], NP [facility] [phone] (Office Phone) [phone] (Office Fax)  03/15/2022 9:38 AM    This note was created with Dragon medical transcription system.  Any error is purely unintentional

## 2022-03-15 NOTE — Clinical Social Work Note (Signed)
  Transition of Care [facility]) Screening Note   Patient Details  Name: Gail Roberts Date of Birth: 10-25-1955   Transition of Care [facility]) CM/SW Contact:    Eileen Stanford, LCSW Phone [place] 03/15/2022, 2:43 PM    Transition of Care Department [facility]) has reviewed patient and no TOC needs have been identified at this time. We will continue to monitor patient advancement through interdisciplinary progression rounds. If new patient transition needs arise, please place a TOC consult.

## 2022-03-15 NOTE — H&P (View-Only) (Signed)
[facility] Vascular Consult Note  MRN : [id number]  Gail Roberts is a 66 y.o. (Jan 10, 1956) female who presents with chief complaint of  Chief Complaint  Patient presents with   DVT  .   Consulting Physician: Shawna Clamp, MD Reason for consult: DVT History of Present Illness: Gail Roberts is a 66 year old female with a previous medical history of right toe surgery on 02/15/2022.  Following the surgery the patient was nonweightbearing.  She was at her follow-up visit to determine her weightbearing status and it was noticed that her right lower extremity was significantly more swollen than her left.  The patient notes that the swelling began several days prior to this.  There was concern for a DVT and noninvasive study showed a DVT extending from the calf to the femoral veins.  Currently the patient denies any chest pain or shortness of breath.  Current Facility-Administered Medications  Medication Dose Route Frequency Provider Last Rate Last Admin   0.9 %  sodium chloride infusion  20 mL/hr Intravenous Continuous Para Skeans, MD 20 mL/hr at 03/15/22 0027 20 mL/hr at 03/15/22 3151   acetaminophen (TYLENOL) tablet 650 mg  650 mg Oral Q6H PRN Para Skeans, MD       Or   acetaminophen (TYLENOL) suppository 650 mg  650 mg Rectal Q6H PRN Para Skeans, MD       heparin ADULT infusion 100 units/mL (25000 units/255m)  1,400 Units/hr Intravenous Continuous PPara Skeans MD 14 mL/hr at 03/15/22 0313 1,400 Units/hr at 03/15/22 0313   hydrALAZINE (APRESOLINE) injection 20 mg  20 mg Intravenous Q6H PRN PPara Skeans MD       HYDROcodone-acetaminophen (NORCO/VICODIN) 5-325 MG per tablet 1 tablet  1 tablet Oral Q4H PRN PPara Skeans MD       morphine (PF) 2 MG/ML injection 2 mg  2 mg Intravenous Q4H PRN PPara Skeans MD       sodium chloride flush (NS) 0.9 % injection 3 mL  3 mL Intravenous Q12H PPara Skeans MD        Past Medical History:  Diagnosis Date    Arthritis    toe    Past Surgical History:  Procedure Laterality Date   ABDOMINAL HYSTERECTOMY     ARTHRODESIS METATARSALPHALANGEAL JOINT (MTPJ) Right 02/15/2022   Procedure: ARTHRODESIS METATARSALPHALANGEAL JOINT (MTPJ) - FIRST;  Surgeon: BCaroline More DPM;  Location: [facility]  Service: Podiatry;  Laterality: Right;  Anesthesia: Choice - pop/saph exparil   BREAST EXCISIONAL BIOPSY Right    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 No pertinent family history.  No Known Allergies   REVIEW OF SYSTEMS (Negative unless checked)  Constitutional: '[]'$ Weight loss  '[]'$ Fever  '[]'$ Chills Cardiac: '[]'$ Chest pain   '[]'$ Chest pressure   '[]'$ Palpitations   '[]'$ Shortness of breath when laying flat   '[]'$ Shortness of breath at rest   '[]'$ Shortness of breath with exertion. Vascular:  '[]'$ Pain in legs with walking   '[]'$ Pain in legs at rest   '[]'$ Pain in legs when laying flat   '[]'$ Claudication   '[]'$ Pain in feet when walking  '[]'$ Pain in feet at rest  '[]'$ Pain in feet  when laying flat   '[]'$ History of DVT   '[]'$ Phlebitis   '[x]'$ Swelling in legs   '[]'$ Varicose veins   '[]'$ Non-healing ulcers Pulmonary:   '[]'$ Uses home oxygen   '[]'$ Productive cough   '[]'$ Hemoptysis   '[]'$ Wheeze  '[]'$ COPD   '[]'$ Asthma Neurologic:  '[]'$ Dizziness  '[]'$ Blackouts   '[]'$ Seizures   '[]'$ History of stroke   '[]'$ History of TIA  '[]'$ Aphasia   '[]'$ Temporary blindness   '[]'$ Dysphagia   '[]'$ Weakness or numbness in arms   '[]'$ Weakness or numbness in legs Musculoskeletal:  '[]'$ Arthritis   '[]'$ Joint swelling   '[]'$ Joint pain   '[]'$ Low back pain Hematologic:  '[]'$ Easy bruising  '[]'$ Easy bleeding   '[]'$ Hypercoagulable state   '[]'$ Anemic  '[]'$ Hepatitis Gastrointestinal:  '[]'$ Blood in stool   '[]'$ Vomiting blood  '[]'$ Gastroesophageal reflux/heartburn   '[]'$ Difficulty  swallowing. Genitourinary:  '[]'$ Chronic kidney disease   '[]'$ Difficult urination  '[]'$ Frequent urination  '[]'$ Burning with urination   '[]'$ Blood in urine Skin:  '[]'$ Rashes   '[]'$ Ulcers   '[]'$ Wounds Psychological:  '[]'$ History of anxiety   '[]'$  History of major depression.  Physical Examination  Vitals:   03/15/22 0037 03/15/22 0437 03/15/22 0438 03/15/22 0800  BP: 133/68 135/70  128/76  Pulse: 74 71  69  Resp: '19 17  18  '$ Temp: 97.8 F (36.6 C) 98.1 F (36.7 C)  98 F (36.7 C)  TempSrc:    Oral  SpO2: 99% 97%  98%  Weight:   95.8 kg   Height:       Body mass index is 28.64 kg/m. Gen:  WD/WN, NAD Head: [place]/AT, No temporalis wasting. Prominent temp pulse not noted. Ear/Nose/Throat: Hearing grossly intact, nares w/o erythema or drainage, oropharynx w/o Erythema/Exudate Eyes: Sclera non-icteric, conjunctiva clear Neck: Trachea midline.  No JVD.  Pulmonary:  Good air movement, respirations not labored, equal bilaterally.  Cardiac: RRR, normal S1, S2. Vascular: 3+ edema right lower extremity with none in the left Vessel Right Left  PT Palpable Palpable  DP Palpable Palpable   Musculoskeletal: M/S 5/5 throughout.  Extremities without ischemic changes.  No deformity or atrophy.  Neurologic: Sensation grossly intact in extremities.  Symmetrical.  Speech is fluent. Motor exam as listed above. Psychiatric: Judgment intact, Mood & affect appropriate for pt's clinical situation. Dermatologic: No rashes or ulcers noted.  No cellulitis or open wounds. Lymph : No Cervical, Axillary, or Inguinal lymphadenopathy.    CBC Lab Results  Component Value Date   WBC 4.4 03/15/2022   HGB 11.6 (L) 03/15/2022   HCT 36.1 03/15/2022   MCV 96.5 03/15/2022   PLT 233 03/15/2022    BMET    Component Value Date/Time   NA 142 03/15/2022 0450   K 3.8 03/15/2022 0450   CL 111 03/15/2022 0450   CO2 26 03/15/2022 0450   GLUCOSE 106 (H) 03/15/2022 0450   BUN 11 03/15/2022 0450   CREATININE 0.55 03/15/2022 0450    CALCIUM 8.6 (L) 03/15/2022 0450   GFRNONAA >60 03/15/2022 0450   Estimated Creatinine Clearance: 91 mL/min (by C-G formula based on SCr of 0.55 mg/dL).  COAG Lab Results  Component Value Date   INR 1.0 03/14/2022    Radiology US Venous Img Lower Unilateral Right (DVT)  Result Date: 03/14/2022 CLINICAL DATA:  66 year old female with right leg swelling. EXAM: RIGHT LOWER EXTREMITY VENOUS DOPPLER ULTRASOUND TECHNIQUE: Gray-scale sonography with graded compression, as well as color Doppler and duplex ultrasound were performed to evaluate the right lower extremity deep venous systems from the level of the common femoral vein and including the common femoral,  femoral, profunda femoral, popliteal and calf veins including the posterior tibial, peroneal and gastrocnemius veins when visible. Spectral Doppler was utilized to evaluate flow at rest and with distal augmentation maneuvers in the common femoral, femoral and popliteal veins. The contralateral common femoral vein was also evaluated for comparison. COMPARISON:  None Available. FINDINGS: RIGHT LOWER EXTREMITY Common Femoral Vein: Hypoechoic, expansile, noncompressible, occlusive thrombus throughout. Central Greater Saphenous Vein: Hypoechoic, expansile, noncompressible, occlusive thrombus throughout. Central Profunda Femoral Vein: Hypoechoic, expansile, noncompressible, occlusive thrombus throughout. Femoral Vein: Hypoechoic, expansile, noncompressible, occlusive thrombus throughout. Popliteal Vein: Hypoechoic, expansile, noncompressible, occlusive thrombus throughout. Calf Veins: Hypoechoic, expansile, noncompressible, occlusive thrombus throughout. Other Findings:  None. LEFT LOWER EXTREMITY Common Femoral Vein: No evidence of thrombus. Normal compressibility, respiratory phasicity and response to augmentation. IMPRESSION: Acute appearing occlusive thrombus extending from the calf veins through the visualized femoral vein. Extent of possible iliocaval  thrombus burden is not evaluated on this study. Recommend CT the abdomen and pelvis for further characterization of possible iliocaval thrombus. These results were called by telephone at the time of interpretation on 03/14/2022 at 3:45 pm to provider Caroline More , who verbally acknowledged these results. Ruthann Cancer, MD [facility] Radiology Electronically Signed   By: Ruthann Cancer M.D.   On: 03/14/2022 15:54   DG MINI C-ARM IMAGE ONLY  Result Date: 02/15/2022 There is no interpretation for this exam.  This order is for images obtained during a surgical procedure.  Please See "Surgeries" Tab for more information regarding the procedure.      Assessment/Plan 1. Deep Vein Thrombosis   The patient has evidence of an extensive DVT extending from her calf veins to her femoral veins.  Based on discussion with the patient this has been ongoing for less than a week.  Because this happened following the patient's to surgery during her nonweightbearing status, this DVT is felt to be provoked.  The patient would likely benefit from a right lower extremity thrombectomy to help with resolution of DVT as well as to help facilitate healing and decrease likelihood of postphlebitic symptoms.  We have discussed the risk, benefits and alternatives.  The patient agrees to proceed with procedure.  We will plan on having the patient undergo thrombectomy on Friday.    Family Communication: Husband at [place], NP [facility] [phone] (Office Phone) [phone] (Office Fax)  03/15/2022 9:38 AM    This note was created with Dragon medical transcription system.  Any error is purely unintentional

## 2022-03-16 ENCOUNTER — Encounter
Admission: EM | Disposition: A | Discharge status: Home / Self Care | Payer: Self-pay | Source: Home / Self Care | Attending: Family Medicine

## 2022-03-16 DIAGNOSIS — I82432 Acute embolism and thrombosis of left popliteal vein: Secondary | ICD-10-CM

## 2022-03-16 DIAGNOSIS — I82431 Acute embolism and thrombosis of right popliteal vein: Principal | ICD-10-CM

## 2022-03-16 DIAGNOSIS — I82412 Acute embolism and thrombosis of left femoral vein: Secondary | ICD-10-CM

## 2022-03-16 DIAGNOSIS — I82422 Acute embolism and thrombosis of left iliac vein: Secondary | ICD-10-CM

## 2022-03-16 DIAGNOSIS — I824Y1 Acute embolism and thrombosis of unspecified deep veins of right proximal lower extremity: Secondary | ICD-10-CM | POA: Diagnosis not present

## 2022-03-16 DIAGNOSIS — I82411 Acute embolism and thrombosis of right femoral vein: Secondary | ICD-10-CM

## 2022-03-16 DIAGNOSIS — I82421 Acute embolism and thrombosis of right iliac vein: Secondary | ICD-10-CM

## 2022-03-16 HISTORY — PX: PERIPHERAL VASCULAR THROMBECTOMY: CATH118306

## 2022-03-16 LAB — HEPARIN LEVEL (UNFRACTIONATED)
Heparin Unfractionated: 0.37 IU/mL (ref 0.30–0.70)
Heparin Unfractionated: 0.21 IU/mL — ABNORMAL LOW (ref 0.30–0.70)

## 2022-03-16 LAB — CBC
HCT: 34.8 % — ABNORMAL LOW (ref 36.0–46.0)
Hemoglobin: 11.4 g/dL — ABNORMAL LOW (ref 12.0–15.0)
MCH: 32 pg (ref 26.0–34.0)
MCHC: 32.8 g/dL (ref 30.0–36.0)
MCV: 97.8 fL (ref 80.0–100.0)
Platelets: 234 10*3/uL (ref 150–400)
RBC: 3.56 MIL/uL — ABNORMAL LOW (ref 3.87–5.11)
RDW: 12.6 % (ref 11.5–15.5)
WBC: 4.1 10*3/uL (ref 4.0–10.5)
nRBC: 0 % (ref 0.0–0.2)

## 2022-03-16 SURGERY — PERIPHERAL VASCULAR THROMBECTOMY
Anesthesia: Moderate Sedation | Laterality: Right

## 2022-03-16 MED ORDER — APIXABAN 5 MG PO TABS
5.0000 mg | ORAL_TABLET | Freq: Two times a day (BID) | ORAL | Status: DC
Start: 2022-03-24 — End: 2022-03-17

## 2022-03-16 MED ORDER — SODIUM CHLORIDE 0.9 % IV SOLN: INTRAVENOUS | Status: DC

## 2022-03-16 MED ORDER — HEPARIN BOLUS VIA INFUSION
1400.0000 [IU] | Freq: Once | INTRAVENOUS | Status: AC
  Administered 2022-03-16: 1400 [IU] via INTRAVENOUS
  Filled 2022-03-16: qty 1400

## 2022-03-16 MED ORDER — DIPHENHYDRAMINE HCL 50 MG/ML IJ SOLN
50.0000 mg | Freq: Once | INTRAMUSCULAR | Status: DC | PRN
Start: 2022-03-16 — End: 2022-03-16

## 2022-03-16 MED ORDER — APIXABAN 5 MG PO TABS
10.0000 mg | ORAL_TABLET | Freq: Two times a day (BID) | ORAL | Status: DC
  Administered 2022-03-17: 10 mg via ORAL
  Filled 2022-03-16: qty 2

## 2022-03-16 MED ORDER — MIDAZOLAM HCL 2 MG/2ML IJ SOLN
INTRAMUSCULAR | Status: AC
  Filled 2022-03-16: qty 4

## 2022-03-16 MED ORDER — HYDROMORPHONE HCL 1 MG/ML IJ SOLN: 1.0000 mg | Freq: Once | INTRAMUSCULAR | Status: DC | PRN

## 2022-03-16 MED ORDER — METHYLPREDNISOLONE SODIUM SUCC 125 MG IJ SOLR: 125.0000 mg | Freq: Once | INTRAMUSCULAR | Status: DC | PRN

## 2022-03-16 MED ORDER — ONDANSETRON HCL 4 MG/2ML IJ SOLN: 4.0000 mg | Freq: Four times a day (QID) | INTRAMUSCULAR | Status: DC | PRN

## 2022-03-16 MED ORDER — HEPARIN SODIUM (PORCINE) 1000 UNIT/ML IJ SOLN
INTRAMUSCULAR | Status: AC
  Filled 2022-03-16: qty 10

## 2022-03-16 MED ORDER — HEPARIN (PORCINE) 25000 UT/250ML-% IV SOLN
1600.0000 [IU]/h | INTRAVENOUS | Status: DC
Start: 2022-03-16 — End: 2022-03-17
  Administered 2022-03-17: 1600 [IU]/h via INTRAVENOUS
  Filled 2022-03-16: qty 250

## 2022-03-16 MED ORDER — CEFAZOLIN SODIUM-DEXTROSE 2-4 GM/100ML-% IV SOLN
INTRAVENOUS | Status: AC
  Filled 2022-03-16: qty 100

## 2022-03-16 MED ORDER — FENTANYL CITRATE PF 50 MCG/ML IJ SOSY
PREFILLED_SYRINGE | INTRAMUSCULAR | Status: AC
  Filled 2022-03-16: qty 2

## 2022-03-16 MED ORDER — MIDAZOLAM HCL 2 MG/2ML IJ SOLN
INTRAMUSCULAR | Status: DC | PRN
  Administered 2022-03-16: 1 mg via INTRAVENOUS
  Administered 2022-03-16: 2 mg via INTRAVENOUS

## 2022-03-16 MED ORDER — FENTANYL CITRATE (PF) 100 MCG/2ML IJ SOLN
INTRAMUSCULAR | Status: DC | PRN
  Administered 2022-03-16 (×2): 50 ug via INTRAVENOUS

## 2022-03-16 MED ORDER — FAMOTIDINE 20 MG PO TABS: 40.0000 mg | ORAL_TABLET | Freq: Once | ORAL | Status: DC | PRN

## 2022-03-16 MED ORDER — ALTEPLASE 1 MG/ML SYRINGE FOR VASCULAR PROCEDURE
INTRAMUSCULAR | Status: DC | PRN
  Administered 2022-03-16: 12 mg via INTRA_ARTERIAL

## 2022-03-16 MED ORDER — CEFAZOLIN SODIUM-DEXTROSE 2-4 GM/100ML-% IV SOLN: 2.0000 g | INTRAVENOUS | Status: DC

## 2022-03-16 MED ORDER — IODIXANOL 320 MG/ML IV SOLN
INTRAVENOUS | Status: DC | PRN
  Administered 2022-03-16: 35 mL

## 2022-03-16 MED ORDER — MIDAZOLAM HCL 2 MG/ML PO SYRP: 8.0000 mg | ORAL_SOLUTION | Freq: Once | ORAL | Status: DC | PRN

## 2022-03-16 SURGICAL SUPPLY — 14 items
CANISTER PENUMBRA ENGINE (MISCELLANEOUS) ×1 IMPLANT
CATH INDIGO 12XTORQ 100 (CATHETERS) ×1 IMPLANT
CATH INDIGO SEP 12 (CATHETERS) ×1 IMPLANT
CATH INFUS 90CMX50CM (CATHETERS) ×1
CATH INFUS UNIFUSE 90X50 5FR (CATHETERS) IMPLANT
CATH KUMPE SOFT-VU 5FR 65 (CATHETERS) ×1 IMPLANT
COVER PROBE U/S 5X48 (MISCELLANEOUS) ×1 IMPLANT
GUIDEWIRE ANGLED .035 180CM (WIRE) ×1 IMPLANT
NDL ENTRY 21GA 7CM ECHOTIP (NEEDLE) IMPLANT
NEEDLE ENTRY 21GA 7CM ECHOTIP (NEEDLE) ×2 IMPLANT
PACK ANGIOGRAPHY (CUSTOM PROCEDURE TRAY) ×2 IMPLANT
SET INTRO CAPELLA COAXIAL (SET/KITS/TRAYS/PACK) ×1 IMPLANT
SHEATH PINNACLE 11FRX10 (SHEATH) ×1 IMPLANT
WIRE GUIDERIGHT .035X150 (WIRE) ×1 IMPLANT

## 2022-03-16 NOTE — Progress Notes (Signed)
PROGRESS NOTE    Gail Roberts  DUK:[id number] DOB: Feb 16, 1956 DOA: 03/14/2022 PCP: Gail Macleod, PA-C   Brief Narrative:  This 66 years old female with PMH significant for vitamin D deficiency presented to the ED with complaints of right leg swelling for last few days.  Patient reports having right foot surgery 4 weeks ago and since then she has been nonweightbearing.  Patient has noticed right leg swelling,  she thought it is probably postoperative swelling and will get better with time.  She denies any pain.  Patient denies any history of blood clots or not taking any hormone replacement therapy.  Work-up in the ED shows extensive DVT in the right calf veins extending into the right femoral vein.  Patient is started on IV heparin.  Vascular surgery was consulted.  Patient is scheduled to have thrombectomy today.  Assessment & Plan:   Principal Problem:   DVT (deep venous thrombosis) (HCC) Active Problems:   Elevated blood-pressure reading without diagnosis of hypertension  DVT(deep venous thrombosis) Likely provoked DVT.  Patient presented with right leg swelling. She underwent right foot surgery 4 weeks ago, remained non weightbearing. Venous duplex confirms extensive DVT from right calf veins,  right femoral vein. Continue heparin drip per pharmacy protocol. Patient denies any leg pain. Vascular surgery consulted, scheduled to have thrombectomy today.  Elevated blood pressure without diagnosis of hypertension: Blood pressure remains elevated, not on any blood pressure medications Echocardiogram shows LVEF 60 to 65%, no regional wall motion abnormalities.   DVT prophylaxis: heparin gtt Code Status: Full code. Family Communication: No family at bed side. Disposition Plan:   Status is: Inpatient Remains inpatient appropriate because:   Admitted for right extensive DVT requiring IV heparin.  Vascular surgery is consulted,  scheduled to have thrombectomy today.    Consultants:  Vascular surgery  Procedures: Scheduled thrombectomy Antimicrobials: None  Subjective: Patient was seen and examined at bedside.  Overnight events noted.   Patient reports feeling better , reports swelling is also getting better. She is scheduled to have thrombectomy today.  Objective: Vitals:   03/15/22 2059 03/16/22 0022 03/16/22 0435 03/16/22 0822  BP: 135/81 131/77 139/73 131/72  Pulse: 74 80 66 64  Resp: '18 16 17   '$ Temp: 97.7 F (36.5 C) 98 F (36.7 C) 98 F (36.7 C) 97.6 F (36.4 C)  TempSrc:      SpO2: 99% 96% 97% 98%  Weight:      Height:        Intake/Output Summary (Last 24 hours) at 03/16/2022 1137 Last data filed at 03/16/2022 0559 Gross per 24 hour  Intake 1159.49 ml  Output --  Net 1159.49 ml   Filed Weights   03/14/22 2007 03/15/22 0027 03/15/22 0438  Weight: 95.3 kg 95.2 kg 95.8 kg    Examination:  General exam: Appears comfortable, not in any acute distress. Respiratory system: CTA bilaterally, normal respiratory effort, RR 15. Cardiovascular system: S1-S2 heard, regular rate and rhythm, no murmur. Gastrointestinal system: Abdomen is soft, non tender, non distended, BS+ Central nervous system: Alert and oriented x 3. No focal neurological deficits. Extremities: Right leg is non tender, mildly swollen, no ulcer or wound,  noticed improvement. Skin: No rashes, lesions or ulcers Psychiatry: Judgement and insight appear normal. Mood & affect appropriate.     Data Reviewed: I have personally reviewed following labs and imaging studies  CBC: Recent Labs  Lab 03/14/22 1959 03/15/22 0450 03/16/22 0311  WBC 5.5 4.4 4.1  NEUTROABS 3.4  --   --  HGB 12.2 11.6* 11.4*  HCT 38.2 36.1 34.8*  MCV 97.7 96.5 97.8  PLT 253 233 465   Basic Metabolic Panel: Recent Labs  Lab 03/14/22 1959 03/15/22 0450  NA 142 142  K 4.3 3.8  CL 107 111  CO2 28 26  GLUCOSE 118* 106*  BUN 11 11  CREATININE 0.67 0.55  CALCIUM 9.0 8.6*    GFR: Estimated Creatinine Clearance: 91 mL/min (by C-G formula based on SCr of 0.55 mg/dL). Liver Function Tests: Recent Labs  Lab 03/15/22 0450  AST 24  ALT 23  ALKPHOS 70  BILITOT 0.4  PROT 6.8  ALBUMIN 3.6   No results for input(s): "LIPASE", "AMYLASE" in the last 168 hours. No results for input(s): "AMMONIA" in the last 168 hours. Coagulation Profile: Recent Labs  Lab 03/14/22 1959  INR 1.0   Cardiac Enzymes: No results for input(s): "CKTOTAL", "CKMB", "CKMBINDEX", "TROPONINI" in the last 168 hours. BNP (last 3 results) No results for input(s): "PROBNP" in the last 8760 hours. HbA1C: No results for input(s): "HGBA1C" in the last 72 hours. CBG: No results for input(s): "GLUCAP" in the last 168 hours. Lipid Profile: No results for input(s): "CHOL", "HDL", "LDLCALC", "TRIG", "CHOLHDL", "LDLDIRECT" in the last 72 hours. Thyroid Function Tests: No results for input(s): "TSH", "T4TOTAL", "FREET4", "T3FREE", "THYROIDAB" in the last 72 hours. Anemia Panel: No results for input(s): "VITAMINB12", "FOLATE", "FERRITIN", "TIBC", "IRON", "RETICCTPCT" in the last 72 hours. Sepsis Labs: No results for input(s): "PROCALCITON", "LATICACIDVEN" in the last 168 hours.  No results found for this or any previous visit (from the past 240 hour(s)).       Radiology Studies: ECHOCARDIOGRAM COMPLETE  Result Date: 03/15/2022    ECHOCARDIOGRAM REPORT   Patient Name:   Gail Roberts [facility] Date of Exam: 03/15/2022 Medical Rec #:  [id number]             Height:       72.0 in Accession #:    [id number]            Weight:       211.2 lb Date of Birth:  07-Jun-1956              BSA:          2.180 m Patient Age:    69 years              BP:           128/76 mmHg Patient Gender: F                     HR:           69 bpm. Exam Location:  [facility] Procedure: 2D Echo, Cardiac Doppler and Color Doppler Indications:     Pulmonary Embolus I26.09  History:         Patient has no prior history of Echocardiogram  examinations.                  DVT.  Sonographer:     Gail Roberts Referring Phys:  [place] Diagnosing Phys: Gail Royals MD IMPRESSIONS  1. Left ventricular ejection fraction, by estimation, is 60 to 65%. The left ventricle has normal function. The left ventricle has no regional wall motion abnormalities. Left ventricular diastolic parameters were normal.  2. Right ventricular systolic function is normal. The right ventricular size is normal.  3. The mitral valve is normal in structure. Mild mitral valve regurgitation.  4. The aortic  valve is normal in structure. Aortic valve regurgitation is trivial. FINDINGS  Left Ventricle: Left ventricular ejection fraction, by estimation, is 60 to 65%. The left ventricle has normal function. The left ventricle has no regional wall motion abnormalities. The left ventricular internal cavity size was normal in size. There is  no left ventricular hypertrophy. Left ventricular diastolic parameters were normal. Right Ventricle: The right ventricular size is normal. No increase in right ventricular wall thickness. Right ventricular systolic function is normal. Left Atrium: Left atrial size was normal in size. Right Atrium: Right atrial size was normal in size. Pericardium: There is no evidence of pericardial effusion. Mitral Valve: The mitral valve is normal in structure. Mild mitral valve regurgitation. MV peak gradient, 2.7 mmHg. The mean mitral valve gradient is 1.0 mmHg. Tricuspid Valve: The tricuspid valve is normal in structure. Tricuspid valve regurgitation is mild. Aortic Valve: The aortic valve is normal in structure. Aortic valve regurgitation is trivial. Aortic valve mean gradient measures 3.0 mmHg. Aortic valve peak gradient measures 5.3 mmHg. Aortic valve area, by VTI measures 2.60 cm. Pulmonic Valve: The pulmonic valve was normal in structure. Pulmonic valve regurgitation is trivial. Aorta: The aortic root and ascending aorta are structurally normal, with  no evidence of dilitation. IAS/Shunts: No atrial level shunt detected by color flow Doppler.  LEFT VENTRICLE PLAX 2D LVIDd:         4.10 cm   Diastology LVIDs:         2.80 cm   LV e' medial:    10.20 cm/s LV PW:         1.10 cm   LV E/e' medial:  6.0 LV IVS:        0.90 cm   LV e' lateral:   9.03 cm/s LVOT diam:     2.00 cm   LV E/e' lateral: 6.8 LV SV:         61 LV SV Index:   28 LVOT Area:     3.14 cm  RIGHT VENTRICLE RV Basal diam:  3.40 cm RV S prime:     13.30 cm/s TAPSE (M-mode): 2.3 cm LEFT ATRIUM             Index        RIGHT ATRIUM           Index LA diam:        3.00 cm 1.38 cm/m   RA Area:     13.40 cm LA Vol (A2C):   54.7 ml 25.09 ml/m  RA Volume:   31.40 ml  14.40 ml/m LA Vol (A4C):   23.7 ml 10.87 ml/m LA Biplane Vol: 39.7 ml 18.21 ml/m  AORTIC VALVE                    PULMONIC VALVE AV Area (Vmax):    2.58 cm     PV Vmax:        1.10 m/s AV Area (Vmean):   2.49 cm     PV Vmean:       71.650 cm/s AV Area (VTI):     2.60 cm     PV VTI:         0.215 m AV Vmax:           115.00 cm/s  PV Peak grad:   4.8 mmHg AV Vmean:          72.900 cm/s  PV Mean grad:   2.0 mmHg AV VTI:  0.236 m      RVOT Peak grad: 5 mmHg AV Peak Grad:      5.3 mmHg AV Mean Grad:      3.0 mmHg LVOT Vmax:         94.60 cm/s LVOT Vmean:        57.700 cm/s LVOT VTI:          0.195 m LVOT/AV VTI ratio: 0.83  AORTA Ao Root diam: 3.13 cm MITRAL VALVE               TRICUSPID VALVE MV Area (PHT): 2.79 cm    TR Peak grad:   12.1 mmHg MV Area VTI:   3.00 cm    TR Vmax:        174.00 cm/s MV Peak grad:  2.7 mmHg MV Mean grad:  1.0 mmHg    SHUNTS MV Vmax:       0.82 m/s    Systemic VTI:  0.20 m MV Vmean:      53.3 cm/s   Systemic Diam: 2.00 cm MV Decel Time: 272 msec    Pulmonic VTI:  0.244 m MV E velocity: 61.70 cm/s MV A velocity: 72.00 cm/s MV E/A ratio:  0.86 Gail Royals MD Electronically signed by Gail Royals MD Signature Date/Time: 03/15/2022/1:30:33 PM    Final    US Venous Img Lower Unilateral Right  (DVT)  Result Date: 03/14/2022 CLINICAL DATA:  66 year old female with right leg swelling. EXAM: RIGHT LOWER EXTREMITY VENOUS DOPPLER ULTRASOUND TECHNIQUE: Gray-scale sonography with graded compression, as well as color Doppler and duplex ultrasound were performed to evaluate the right lower extremity deep venous systems from the level of the common femoral vein and including the common femoral, femoral, profunda femoral, popliteal and calf veins including the posterior tibial, peroneal and gastrocnemius veins when visible. Spectral Doppler was utilized to evaluate flow at rest and with distal augmentation maneuvers in the common femoral, femoral and popliteal veins. The contralateral common femoral vein was also evaluated for comparison. COMPARISON:  None Available. FINDINGS: RIGHT LOWER EXTREMITY Common Femoral Vein: Hypoechoic, expansile, noncompressible, occlusive thrombus throughout. Central Greater Saphenous Vein: Hypoechoic, expansile, noncompressible, occlusive thrombus throughout. Central Profunda Femoral Vein: Hypoechoic, expansile, noncompressible, occlusive thrombus throughout. Femoral Vein: Hypoechoic, expansile, noncompressible, occlusive thrombus throughout. Popliteal Vein: Hypoechoic, expansile, noncompressible, occlusive thrombus throughout. Calf Veins: Hypoechoic, expansile, noncompressible, occlusive thrombus throughout. Other Findings:  None. LEFT LOWER EXTREMITY Common Femoral Vein: No evidence of thrombus. Normal compressibility, respiratory phasicity and response to augmentation. IMPRESSION: Acute appearing occlusive thrombus extending from the calf veins through the visualized femoral vein. Extent of possible iliocaval thrombus burden is not evaluated on this study. Recommend CT the abdomen and pelvis for further characterization of possible iliocaval thrombus. These results were called by telephone at the time of interpretation on 03/14/2022 at 3:45 pm to provider Caroline More , who verbally  acknowledged these results. Ruthann Cancer, MD [facility] Radiology Electronically Signed   By: Ruthann Cancer M.D.   On: 03/14/2022 15:54     Scheduled Meds:  sodium chloride flush  3 mL Intravenous Q12H   Continuous Infusions:  sodium chloride Stopped (03/16/22 0126)   sodium chloride 75 mL/hr at 03/16/22 0559   heparin 1,400 Units/hr (03/16/22 0559)     LOS: 1 day    Time spent: 32 mins    Laketha Leopard, MD [facility]   If 7PM-7AM, please contact night-coverage

## 2022-03-16 NOTE — Interval H&P Note (Signed)
History and Physical Interval Note:  03/16/2022 1:35 PM  Gail Roberts  has presented today for surgery, with the diagnosis of leg DVT.  The various methods of treatment have been discussed with the patient and family. After consideration of risks, benefits and other options for treatment, the patient has consented to  Procedure(s): PERIPHERAL VASCULAR THROMBECTOMY (Right) as a surgical intervention.  The patient's history has been reviewed, patient examined, no change in status, stable for surgery.  I have reviewed the patient's chart and labs.  Questions were answered to the patient's satisfaction.     Hortencia Pilar

## 2022-03-16 NOTE — Progress Notes (Signed)
[facility] [place] for heparin infusion initiation and monitoring Indication: DVT  No Known Allergies  Patient Measurements: Height: 6' (182.9 cm) Weight: 97.5 kg (215 lb) IBW/kg (Calculated) : 73.1 Heparin Dosing Weight: 92.5 kg  Vital Signs: Temp: 98.6 F (37 C) (06/09 2215) Temp Source: Oral (06/09 2215) BP: 147/65 (06/09 2215) Pulse Rate: 81 (06/09 2215)  Labs: Recent Labs    03/14/22 1959 03/14/22 1959 03/15/22 0450 03/15/22 1148 03/16/22 0311 03/16/22 2157  HGB 12.2  --  11.6*  --  11.4*  --   HCT 38.2  --  36.1  --  34.8*  --   PLT 253  --  233  --  234  --   APTT 29  --   --   --   --   --   LABPROT 13.2  --   --   --   --   --   INR 1.0  --   --   --   --   --   HEPARINUNFRC  --    < > 0.35 0.37 0.37 0.21*  CREATININE 0.67  --  0.55  --   --   --   TROPONINIHS 3  --   --   --   --   --    < > = values in this interval not displayed.     Estimated Creatinine Clearance: 91.8 mL/min (by C-G formula based on SCr of 0.55 mg/dL).   Medical History: Past Medical History:  Diagnosis Date   Arthritis    toe    Assessment: 73 yof with c/o week of right leg swelling. venous US shows acute appearing occlusive thrombus extending from the calf veins through the visualized femoral vein. A review of dispense history reveals no chronic anticoagulation.   Date Time HL  Rate/comment 6/8  0450 0.35 1400 un/hr, therapeutic x1 6/8 1148 0.37 1400 un/hr, therapeutic x2 6/9       0311    0.37    1400 un/hr, therapeutic X 3  6/9       2157    0.21    1400 un/hr, SUBtherapeutic   Goal of Therapy:  Heparin level 0.3-0.7 units/ml Monitor platelets by anticoagulation protocol: Yes   Plan:  6/9:  HL @ 2157 = 0.21, SUBtherapeutic  Will order heparin 1400 units IV X 1 bolus and increase drip rate to 1600 units/hr. Will recheck HL 6 hrs after rate change. Apixaban to start on 6/10 @ 1000 , need to ensure heparin drip is d/c'd .  Lumi Winslett  D, PharmD 03/16/2022,11:13 PM

## 2022-03-16 NOTE — Progress Notes (Signed)
[facility] for heparin infusion initiation and monitoring Indication: DVT  No Known Allergies  Patient Measurements: Height: 6' (182.9 cm) Weight: 95.8 kg (211 lb 3.2 oz) IBW/kg (Calculated) : 73.1 Heparin Dosing Weight: 92.5 kg  Vital Signs: Temp: 98 F (36.7 C) (06/09 0435) BP: 139/73 (06/09 0435) Pulse Rate: 66 (06/09 0435)  Labs: Recent Labs    03/14/22 1959 03/15/22 0450 03/15/22 1148 03/16/22 0311  HGB 12.2 11.6*  --  11.4*  HCT 38.2 36.1  --  34.8*  PLT 253 233  --  234  APTT 29  --   --   --   LABPROT 13.2  --   --   --   INR 1.0  --   --   --   HEPARINUNFRC  --  0.35 0.37 0.37  CREATININE 0.67 0.55  --   --   TROPONINIHS 3  --   --   --      Estimated Creatinine Clearance: 91 mL/min (by C-G formula based on SCr of 0.55 mg/dL).   Medical History: Past Medical History:  Diagnosis Date   Arthritis    toe    Assessment: 6 yof with c/o week of right leg swelling. venous US shows acute appearing occlusive thrombus extending from the calf veins through the visualized femoral vein. A review of dispense history reveals no chronic anticoagulation.   Date Time HL  Rate/comment 6/8  0450 0.35 1400 un/hr, therapeutic x1 6/8 1148 0.37 1400 un/hr, therapeutic x2 6/9       0311    0.37    1499 un/hr, therapeutic X 3   Goal of Therapy:  Heparin level 0.3-0.7 units/ml Monitor platelets by anticoagulation protocol: Yes   Plan:  6/9: HL @ 0311 = 0.37, therapeutic X 3 Will continue pt on current rate and recheck HL on 6/10 with AM Labs.    Donnie Gedeon D, PharmD 03/16/2022,4:44 AM

## 2022-03-16 NOTE — Op Note (Signed)
VEIN AND VASCULAR SURGERY   OPERATIVE NOTE   PRE-OPERATIVE DIAGNOSIS: Symptomatic extensive right DVT  POST-OPERATIVE DIAGNOSIS: same   PROCEDURE: 1.   US guidance for vascular access to right popliteal vein 2.   Catheter placement into a pelvic collateral vein from right popliteal approach 3.   IVC gram and right leg venogram 4.   Catheter directed thrombolysis with 12 mg of tPA to the right common femoral superficial femoral and proximal popliteal veins 5.   Mechanical thrombectomy to the right common femoral superficial femoral and proximal popliteal veins    SURGEON: Hortencia Pilar, MD  ASSISTANT(S): none  ANESTHESIA: Continuous ECG pulse oximetry and cardiopulmonary monitoring performed throughout the entire procedure by the interventional radiology nurse.  Total sedation time was 1 hour 32 seconds   ESTIMATED BLOOD LOSS: 100 cc  FINDING(S): 1.  Occlusive thrombus throughout the right popliteal, superficial femoral vein, common femoral vein and large pelvic collaterals reconstituting the internal iliac vein.  There is a flush occlusion of the external iliac vein on the right and it remains occluded throughout based on this study  SPECIMEN(S):  none  INDICATIONS:    Patient is a 66 y.o. female who presents with a massively swollen painful right lower extremity.  Patient has marked leg swelling and pain.  Venous intervention is performed to reduce the symtpoms and avoid long term postphlebitic symptoms.  Risk and benefits were reviewed all questions were answered patient has agreed to proceed.  DESCRIPTION: After obtaining full informed written consent, the patient was brought back to the vascular suite and placed supine upon the table. Moderate conscious sedation was administered during a face to face encounter with the patient throughout the procedure with my supervision of the RN administering medicines and monitoring the patient's vital signs, pulse oximetry,  telemetry and mental status throughout from the start of the procedure until the patient was taken to the recovery room.  After obtaining adequate anesthesia, the patient was prepped and draped in the standard fashion.     The patient was then placed into the prone position.  The right popliteal vein was then accessed under direct ultrasound guidance without difficulty with a micropuncture needle and a permanent image was recorded.  I then placed an 11Fr sheath over a J wire.   Initial imaging showed extensive DVT with minimal flow throughout the femoral-popliteal veins.  A Kumpe catheter and Magic tourque wire were then advanced into the CFV and images were performed.  This showed a extremely large collateral vein that coursed from the proximal common femoral vein into the pelvis essentially at the midline and then appeared to reconstitute the internal iliac vein.  I was able to cross the thrombus and into the collateral vein but could never negotiate the wire and catheter into the internal iliac vein.  Multiple attempts were made but I was unable to cross the external iliac venous occlusion.  I then used the infusion catheter and instilled 12 mg of tpa throughout the collateral veins, common femoral superficial femoral and popliteal veins.  After this dwelled for 15 minutes, I used the Penumbra Cat 12 catheter and evacuated about 200 cc of effluent with mechanical thrombectomy throughout the iliac veins, CFV, SFV, and collateral veins.   After multiple passes and follow-up imaging which demonstrated several isolated places that needed further thrombectomy I did a final venogram from the popliteal distally.  After review these images greater than 90% of the thrombus had been removed and there was now  forward flow throughout the entire venous system.  I then elected to terminate the procedure.  The sheath was removed and a dressing was placed.  She was taken to the recovery room in stable condition having  tolerated the procedure well.    COMPLICATIONS: None  CONDITION: Stable  Hortencia Pilar 03/16/2022 2:48 PM

## 2022-03-17 DIAGNOSIS — I824Y1 Acute embolism and thrombosis of unspecified deep veins of right proximal lower extremity: Secondary | ICD-10-CM | POA: Diagnosis not present

## 2022-03-17 LAB — HEPARIN LEVEL (UNFRACTIONATED): Heparin Unfractionated: 0.45 IU/mL (ref 0.30–0.70)

## 2022-03-17 MED ORDER — APIXABAN 5 MG PO TABS: 10.0000 mg | ORAL_TABLET | Freq: Two times a day (BID) | ORAL | 0 refills | Status: DC

## 2022-03-17 MED ORDER — APIXABAN 5 MG PO TABS: 5.0000 mg | ORAL_TABLET | Freq: Two times a day (BID) | ORAL | 3 refills | Status: DC

## 2022-03-17 NOTE — Discharge Instructions (Signed)
Advised to follow up vasculer surgery as scheduled. Advised to take eliquis 10 mg bid for 7 days followed by eliquis 5 mg bid for 3- 6 months.

## 2022-03-17 NOTE — Progress Notes (Signed)
1 Day Post-Op   Subjective/Chief Complaint: Doing well. States RIGHT leg feels much better. Decreased edema. Denies pain.   Objective: Vital signs in last 24 hours: Temp:  [97.8 F (36.6 C)-98.9 F (37.2 C)] 98 F (36.7 C) (06/10 0916) Pulse Rate:  [0-81] 77 (06/10 0916) Resp:  [11-18] 18 (06/10 0916) BP: (125-172)/(61-98) 126/61 (06/10 0916) SpO2:  [95 %-100 %] 99 % (06/10 0916) Weight:  [97.5 kg-113.7 kg] 103.6 kg (06/10 0655) Last BM Date : 03/16/22  Intake/Output from previous day: 06/09 0701 - 06/10 0700 In: 717.1 [P.O.:480; I.V.:237.1] Out: -  Intake/Output this shift: No intake/output data recorded.  General appearance: alert and no distress Resp: clear to auscultation bilaterally Extremities: edema RIGHT Leg soft  Lab Results:  Recent Labs    03/15/22 0450 03/16/22 0311  WBC 4.4 4.1  HGB 11.6* 11.4*  HCT 36.1 34.8*  PLT 233 234   BMET Recent Labs    03/14/22 1959 03/15/22 0450  NA 142 142  K 4.3 3.8  CL 107 111  CO2 28 26  GLUCOSE 118* 106*  BUN 11 11  CREATININE 0.67 0.55  CALCIUM 9.0 8.6*   PT/INR Recent Labs    03/14/22 1959  LABPROT 13.2  INR 1.0   ABG No results for input(s): "PHART", "HCO3" in the last 72 hours.  Invalid input(s): "PCO2", "PO2"  Studies/Results: PERIPHERAL VASCULAR CATHETERIZATION  Result Date: 03/16/2022 See surgical note for result.   Anti-infectives: Anti-infectives (From admission, onward)    Start     Dose/Rate Route Frequency Ordered Stop   03/16/22 1150  ceFAZolin (ANCEF) 2-4 GM/100ML-% IVPB       Note to Pharmacy: Corlis Hove H: cabinet override      03/16/22 1150 03/16/22 2359   03/16/22 1149  ceFAZolin (ANCEF) IVPB 2g/100 mL premix  Status:  Discontinued        2 g 200 mL/hr over 30 Minutes Intravenous 30 min pre-op 03/16/22 1149 03/16/22 1619       Assessment/Plan: s/p Procedure(s): PERIPHERAL VASCULAR THROMBECTOMY (Right) POD #1 s/p right LE thrombectomy -extensive RIGHT LE  DVT Resume Eliquis OK from Vascular Standpoint to D/C Home  LOS: 2 days    Jamesetta So A 03/17/2022

## 2022-03-17 NOTE — Progress Notes (Signed)
[place] for heparin infusion initiation and monitoring Indication: DVT  No Known Allergies  Patient Measurements: Height: 6' (182.9 cm) Weight: 103.6 kg (228 lb 6.3 oz) IBW/kg (Calculated) : 73.1 Heparin Dosing Weight: 92.5 kg  Vital Signs: Temp: 97.9 F (36.6 C) (06/10 0412) Temp Source: Oral (06/10 0412) BP: 125/68 (06/10 0412) Pulse Rate: 67 (06/10 0412)  Labs: Recent Labs    03/14/22 1959 03/15/22 0450 03/15/22 1148 03/16/22 0311 03/16/22 2157 03/17/22 0629  HGB 12.2 11.6*  --  11.4*  --   --   HCT 38.2 36.1  --  34.8*  --   --   PLT 253 233  --  234  --   --   APTT 29  --   --   --   --   --   LABPROT 13.2  --   --   --   --   --   INR 1.0  --   --   --   --   --   HEPARINUNFRC  --  0.35   < > 0.37 0.21* 0.45  CREATININE 0.67 0.55  --   --   --   --   TROPONINIHS 3  --   --   --   --   --    < > = values in this interval not displayed.     Estimated Creatinine Clearance: 94.4 mL/min (by C-G formula based on SCr of 0.55 mg/dL).   Medical History: Past Medical History:  Diagnosis Date   Arthritis    toe    Assessment: 17 yof with c/o week of right leg swelling. venous US shows acute appearing occlusive thrombus extending from the calf veins through the visualized femoral vein. A review of dispense history reveals no chronic anticoagulation.   Date Time HL  Rate/comment 6/8  0450 0.35 1400 un/hr, therapeutic x1 6/8 1148 0.37 1400 un/hr, therapeutic x2 6/9       0311    0.37    1400 un/hr, therapeutic X 3  6/9       2157    0.21    1400 un/hr, SUBtherapeutic  6/10     0629    0.45    1600 un/hr, therapeutic X 1   Goal of Therapy:  Heparin level 0.3-0.7 units/ml Monitor platelets by anticoagulation protocol: Yes   Plan:  6/10:  HL @ 0629 = 0.45, therapeutic X 1 Will continue pt on current rate and recheck HL on 6/10 @ 1200.  Apixaban to start on 6/10 @ 1000 , need to ensure heparin drip is d/c'd  .  Aadhav Uhlig D, PharmD 03/17/2022,7:16 AM

## 2022-03-17 NOTE — Discharge Summary (Signed)
Physician Discharge Summary  [name] DXA:[id number] DOB: [birth year] DOA: 03/14/2022  PCP: Derald Macleod, PA-C  Admit date: 03/14/2022  Discharge date: 03/17/2022  Admitted From: Home  Disposition:  Home  Recommendations for Outpatient Follow-up:  Follow up with PCP in 1-2 weeks Please obtain BMP/CBC in one week Advised to follow up vasculer surgery as scheduled. Advised to take eliquis 10 mg bid for 7 days followed by eliquis 5 mg bid for 3- 6 months.  Home Health:None Equipment/Devices:None  Discharge Condition: Stable CODE STATUS:Full code Diet recommendation: Heart Healthy  Brief Summary / Hospital Course: This 66 years old female with PMH significant for vitamin D deficiency presented to the ED with complaints of right leg swelling for last few days.  Patient reports having right foot surgery 4 weeks ago and since then she has been non weightbearing right leg.  Patient has noticed right leg swelling,  she thought it is probably post operative swelling and will get better with time.  She denies any pain.  Patient denies any history of blood clots or not taking any hormone replacement therapy.  Work-up in the ED shows extensive DVT in the right calf veins extending into the right femoral vein.  Patient was started on IV heparin.  Vascular surgery was consulted.  Patient was admitted for extensive DVT.  Patient underwent successful thrombectomy for the DVT.  Patient tolerated procedure well.  Heparin discontinued and started on Eliquis.  Patient is cleared from vascular surgery to be discharged.  Patient will follow-up outpatient.  Patient will continue Eliquis for 3 to 6 months.  Discharge Diagnoses:  Principal Problem:   DVT (deep venous thrombosis) (HCC) Active Problems:   Elevated blood-pressure reading without diagnosis of hypertension  DVT(deep venous thrombosis) Likely provoked DVT.  Patient presented with right leg swelling. She underwent right foot surgery 4  weeks ago, remained non weightbearing. Venous duplex confirms extensive DVT from right calf veins,  right femoral vein. Continue heparin drip per pharmacy protocol. Patient denies any leg pain. Vascular surgery consulted, underwent successful thrombectomy. Heparin discontinued transitioned on Eliquis. Patient can be discharged from vascular perspective.   Elevated blood pressure without diagnosis of hypertension: Blood pressure remains elevated, not on any blood pressure medications Echocardiogram shows LVEF 60 to 65%, no regional wall motion abnormalities.    Discharge Instructions  Discharge Instructions     Call MD for:  persistant dizziness or light-headedness   Complete by: As directed    Call MD for:  persistant nausea and vomiting   Complete by: As directed    Diet - low sodium heart healthy   Complete by: As directed    Diet Carb Modified   Complete by: As directed    Discharge instructions   Complete by: As directed    Advised to follow up vasculer surgery as scheduled. Advised to take eliquis 10 mg bid for 7 days followed by eliquis 5 mg bid for 3- 6 months.   Discharge wound care:   Complete by: As directed    Follow up podiatry as scheduled.   Increase activity slowly   Complete by: As directed       Allergies as of 03/17/2022   No Known Allergies      Medication List     STOP taking these medications    aspirin EC 81 MG tablet       TAKE these medications    acetaminophen 500 MG tablet Commonly known as: TYLENOL Take 500 mg by mouth  every 6 (six) hours as needed.   apixaban 5 MG Tabs tablet Commonly known as: ELIQUIS Take 2 tablets (10 mg total) by mouth 2 (two) times daily.   apixaban 5 MG Tabs tablet Commonly known as: ELIQUIS Take 1 tablet (5 mg total) by mouth 2 (two) times daily. Start taking on: March 24, 2022   Cranberry 250 MG Tabs Take 1 tablet by mouth daily.   docusate sodium 100 MG capsule Commonly known as: COLACE Take  100 mg by mouth daily.   vitamin C 500 MG tablet Commonly known as: ASCORBIC ACID Take 500 mg by mouth daily.   Vitamin D3 50 MCG (2000 UT) capsule Take 2,000 Units by mouth daily.               Discharge Care Instructions  (From admission, onward)           Start     Ordered   03/17/22 0000  Discharge wound care:       Comments: Follow up podiatry as scheduled.   03/17/22 1211            Follow-up Information     Schnier, Dolores Lory, MD Follow up in 2 week(s).   Specialties: Vascular Surgery, Cardiology, Radiology, Vascular Surgery Why: follow up after procedure right leg DVT Contact information: [place] [address] [phone]         Derald Macleod, PA-C Follow up in 1 week(s).   Specialty: Internal Medicine Contact information: [address] [place] [address] [phone]                No Known Allergies  Consultations: Vascular surgery   Procedures/Studies: PERIPHERAL VASCULAR CATHETERIZATION  Result Date: 03/16/2022 See surgical note for result.  ECHOCARDIOGRAM COMPLETE  Result Date: 03/15/2022    ECHOCARDIOGRAM REPORT   Patient Name:   Gail Roberts [facility] Date of Exam: 03/15/2022 Medical Rec #:  [id number]             Height:       72.0 in Accession #:    [id number]            Weight:       211.2 lb Date of Birth:  1956/07/10              BSA:          2.180 m Patient Age:    66 years              BP:           128/76 mmHg Patient Gender: F                     HR:           69 bpm. Exam Location:  [facility] Procedure: 2D Echo, Cardiac Doppler and Color Doppler Indications:     Pulmonary Embolus I26.09  History:         Patient has no prior history of Echocardiogram examinations.                  DVT.  Sonographer:     Sherrie Sport Referring Phys:  [place] Diagnosing Phys: Serafina Royals MD IMPRESSIONS  1. Left ventricular ejection fraction, by estimation, is 60 to 65%. The left ventricle  has normal function. The left ventricle has no regional wall motion abnormalities. Left ventricular diastolic parameters were normal.  2. Right ventricular systolic function is  normal. The right ventricular size is normal.  3. The mitral valve is normal in structure. Mild mitral valve regurgitation.  4. The aortic valve is normal in structure. Aortic valve regurgitation is trivial. FINDINGS  Left Ventricle: Left ventricular ejection fraction, by estimation, is 60 to 65%. The left ventricle has normal function. The left ventricle has no regional wall motion abnormalities. The left ventricular internal cavity size was normal in size. There is  no left ventricular hypertrophy. Left ventricular diastolic parameters were normal. Right Ventricle: The right ventricular size is normal. No increase in right ventricular wall thickness. Right ventricular systolic function is normal. Left Atrium: Left atrial size was normal in size. Right Atrium: Right atrial size was normal in size. Pericardium: There is no evidence of pericardial effusion. Mitral Valve: The mitral valve is normal in structure. Mild mitral valve regurgitation. MV peak gradient, 2.7 mmHg. The mean mitral valve gradient is 1.0 mmHg. Tricuspid Valve: The tricuspid valve is normal in structure. Tricuspid valve regurgitation is mild. Aortic Valve: The aortic valve is normal in structure. Aortic valve regurgitation is trivial. Aortic valve mean gradient measures 3.0 mmHg. Aortic valve peak gradient measures 5.3 mmHg. Aortic valve area, by VTI measures 2.60 cm. Pulmonic Valve: The pulmonic valve was normal in structure. Pulmonic valve regurgitation is trivial. Aorta: The aortic root and ascending aorta are structurally normal, with no evidence of dilitation. IAS/Shunts: No atrial level shunt detected by color flow Doppler.  LEFT VENTRICLE PLAX 2D LVIDd:         4.10 cm   Diastology LVIDs:         2.80 cm   LV e' medial:    10.20 cm/s LV PW:         1.10 cm   LV E/e'  medial:  6.0 LV IVS:        0.90 cm   LV e' lateral:   9.03 cm/s LVOT diam:     2.00 cm   LV E/e' lateral: 6.8 LV SV:         61 LV SV Index:   28 LVOT Area:     3.14 cm  RIGHT VENTRICLE RV Basal diam:  3.40 cm RV S prime:     13.30 cm/s TAPSE (M-mode): 2.3 cm LEFT ATRIUM             Index        RIGHT ATRIUM           Index LA diam:        3.00 cm 1.38 cm/m   RA Area:     13.40 cm LA Vol (A2C):   54.7 ml 25.09 ml/m  RA Volume:   31.40 ml  14.40 ml/m LA Vol (A4C):   23.7 ml 10.87 ml/m LA Biplane Vol: 39.7 ml 18.21 ml/m  AORTIC VALVE                    PULMONIC VALVE AV Area (Vmax):    2.58 cm     PV Vmax:        1.10 m/s AV Area (Vmean):   2.49 cm     PV Vmean:       71.650 cm/s AV Area (VTI):     2.60 cm     PV VTI:         0.215 m AV Vmax:           115.00 cm/s  PV Peak grad:   4.8 mmHg AV Vmean:  72.900 cm/s  PV Mean grad:   2.0 mmHg AV VTI:            0.236 m      RVOT Peak grad: 5 mmHg AV Peak Grad:      5.3 mmHg AV Mean Grad:      3.0 mmHg LVOT Vmax:         94.60 cm/s LVOT Vmean:        57.700 cm/s LVOT VTI:          0.195 m LVOT/AV VTI ratio: 0.83  AORTA Ao Root diam: 3.13 cm MITRAL VALVE               TRICUSPID VALVE MV Area (PHT): 2.79 cm    TR Peak grad:   12.1 mmHg MV Area VTI:   3.00 cm    TR Vmax:        174.00 cm/s MV Peak grad:  2.7 mmHg MV Mean grad:  1.0 mmHg    SHUNTS MV Vmax:       0.82 m/s    Systemic VTI:  0.20 m MV Vmean:      53.3 cm/s   Systemic Diam: 2.00 cm MV Decel Time: 272 msec    Pulmonic VTI:  0.244 m MV E velocity: 61.70 cm/s MV A velocity: 72.00 cm/s MV E/A ratio:  0.86 Serafina Royals MD Electronically signed by Serafina Royals MD Signature Date/Time: 03/15/2022/1:30:33 PM    Final    US Venous Img Lower Unilateral Right (DVT)  Result Date: 03/14/2022 CLINICAL DATA:  66 year old female with right leg swelling. EXAM: RIGHT LOWER EXTREMITY VENOUS DOPPLER ULTRASOUND TECHNIQUE: Gray-scale sonography with graded compression, as well as color Doppler and duplex  ultrasound were performed to evaluate the right lower extremity deep venous systems from the level of the common femoral vein and including the common femoral, femoral, profunda femoral, popliteal and calf veins including the posterior tibial, peroneal and gastrocnemius veins when visible. Spectral Doppler was utilized to evaluate flow at rest and with distal augmentation maneuvers in the common femoral, femoral and popliteal veins. The contralateral common femoral vein was also evaluated for comparison. COMPARISON:  None Available. FINDINGS: RIGHT LOWER EXTREMITY Common Femoral Vein: Hypoechoic, expansile, noncompressible, occlusive thrombus throughout. Central Greater Saphenous Vein: Hypoechoic, expansile, noncompressible, occlusive thrombus throughout. Central Profunda Femoral Vein: Hypoechoic, expansile, noncompressible, occlusive thrombus throughout. Femoral Vein: Hypoechoic, expansile, noncompressible, occlusive thrombus throughout. Popliteal Vein: Hypoechoic, expansile, noncompressible, occlusive thrombus throughout. Calf Veins: Hypoechoic, expansile, noncompressible, occlusive thrombus throughout. Other Findings:  None. LEFT LOWER EXTREMITY Common Femoral Vein: No evidence of thrombus. Normal compressibility, respiratory phasicity and response to augmentation. IMPRESSION: Acute appearing occlusive thrombus extending from the calf veins through the visualized femoral vein. Extent of possible iliocaval thrombus burden is not evaluated on this study. Recommend CT the abdomen and pelvis for further characterization of possible iliocaval thrombus. These results were called by telephone at the time of interpretation on 03/14/2022 at 3:45 pm to provider Caroline More , who verbally acknowledged these results. Ruthann Cancer, MD [facility] Radiology Electronically Signed   By: Ruthann Cancer M.D.   On: 03/14/2022 15:54    Thrombectomy for extensive right leg  DVT   Subjective: Patient was seen and examined at bedside.  Overnight events noted.  Patient reports feeling better , right leg swelling is improved.  Discharge Exam: Vitals:   03/17/22 0916 03/17/22 1204  BP: 126/61 130/64  Pulse: 77 70  Resp: 18 18  Temp:  98 F (36.7 C) 98 F (36.7 C)  SpO2: 99% 97%   Vitals:   03/17/22 0500 03/17/22 0655 03/17/22 0916 03/17/22 1204  BP:   126/61 130/64  Pulse:   77 70  Resp:   18 18  Temp:   98 F (36.7 C) 98 F (36.7 C)  TempSrc:    Oral  SpO2:   99% 97%  Weight: 113.7 kg 103.6 kg    Height:        General: Pt is alert, awake, not in acute distress Cardiovascular: RRR, S1/S2 +, no rubs, no gallops Respiratory: CTA bilaterally, no wheezing, no rhonchi Abdominal: Soft, NT, ND, bowel sounds + Extremities: no edema, no cyanosis    The results of significant diagnostics from this hospitalization (including imaging, microbiology, ancillary and laboratory) are listed below for reference.     Microbiology: No results found for this or any previous visit (from the past 240 hour(s)).   Labs: BNP (last 3 results) Recent Labs    03/14/22 1959  BNP 76.7   Basic Metabolic Panel: Recent Labs  Lab 03/14/22 1959 03/15/22 0450  NA 142 142  K 4.3 3.8  CL 107 111  CO2 28 26  GLUCOSE 118* 106*  BUN 11 11  CREATININE 0.67 0.55  CALCIUM 9.0 8.6*   Liver Function Tests: Recent Labs  Lab 03/15/22 0450  AST 24  ALT 23  ALKPHOS 70  BILITOT 0.4  PROT 6.8  ALBUMIN 3.6   No results for input(s): "LIPASE", "AMYLASE" in the last 168 hours. No results for input(s): "AMMONIA" in the last 168 hours. CBC: Recent Labs  Lab 03/14/22 1959 03/15/22 0450 03/16/22 0311  WBC 5.5 4.4 4.1  NEUTROABS 3.4  --   --   HGB 12.2 11.6* 11.4*  HCT 38.2 36.1 34.8*  MCV 97.7 96.5 97.8  PLT 253 233 234   Cardiac Enzymes: No results for input(s): "CKTOTAL", "CKMB", "CKMBINDEX", "TROPONINI" in the last 168 hours. BNP: Invalid input(s):  "POCBNP" CBG: No results for input(s): "GLUCAP" in the last 168 hours. D-Dimer No results for input(s): "DDIMER" in the last 72 hours. Hgb A1c No results for input(s): "HGBA1C" in the last 72 hours. Lipid Profile No results for input(s): "CHOL", "HDL", "LDLCALC", "TRIG", "CHOLHDL", "LDLDIRECT" in the last 72 hours. Thyroid function studies No results for input(s): "TSH", "T4TOTAL", "T3FREE", "THYROIDAB" in the last 72 hours.  Invalid input(s): "FREET3" Anemia work up No results for input(s): "VITAMINB12", "FOLATE", "FERRITIN", "TIBC", "IRON", "RETICCTPCT" in the last 72 hours. Urinalysis No results found for: "COLORURINE", "APPEARANCEUR", "LABSPEC", "PHURINE", "GLUCOSEU", "HGBUR", "BILIRUBINUR", "KETONESUR", "PROTEINUR", "UROBILINOGEN", "NITRITE", "LEUKOCYTESUR" Sepsis Labs Recent Labs  Lab 03/14/22 1959 03/15/22 0450 03/16/22 0311  WBC 5.5 4.4 4.1   Microbiology No results found for this or any previous visit (from the past 240 hour(s)).   Time coordinating discharge: Over 30 minutes  SIGNED:   Shawna Clamp, MD  [facility] 03/17/2022, 2:19 PM Pager   If 7PM-7AM, please contact night-coverage

## 2022-03-19 ENCOUNTER — Encounter: Payer: Self-pay | Admitting: Vascular Surgery

## 2022-04-05 ENCOUNTER — Ambulatory Visit (INDEPENDENT_AMBULATORY_CARE_PROVIDER_SITE_OTHER): Payer: Self-pay | Admitting: Vascular Surgery

## 2022-04-06 ENCOUNTER — Encounter (INDEPENDENT_AMBULATORY_CARE_PROVIDER_SITE_OTHER): Payer: Self-pay | Admitting: Nurse Practitioner

## 2022-04-06 ENCOUNTER — Ambulatory Visit (INDEPENDENT_AMBULATORY_CARE_PROVIDER_SITE_OTHER): Payer: Medicare HMO | Admitting: Nurse Practitioner

## 2022-04-06 VITALS — BP 156/83 | HR 71 | Resp 16 | Ht 72.0 in | Wt 224.0 lb

## 2022-04-06 DIAGNOSIS — I824Y1 Acute embolism and thrombosis of unspecified deep veins of right proximal lower extremity: Secondary | ICD-10-CM | POA: Diagnosis not present

## 2022-04-22 ENCOUNTER — Encounter (INDEPENDENT_AMBULATORY_CARE_PROVIDER_SITE_OTHER): Payer: Self-pay | Admitting: Nurse Practitioner

## 2022-04-22 NOTE — Progress Notes (Signed)
Subjective:    Patient ID: Renaye Rakers, female    DOB: Mar 01, 1956, 66 y.o.   MRN: [id number] No chief complaint on file.   Laylah Riga is a 66 year old female with a previous medical history of right toe surgery on 02/15/2022.  Following the surgery the patient was nonweightbearing.  She was at her follow-up visit to determine her weightbearing status and it was noticed that her right lower extremity was significantly more swollen than her left.  The patient notes that the swelling began several days prior to this.  There was concern for a DVT and noninvasive study showed a DVT extending from the calf to the femoral veins.  The patient underwent a right lower extremity thrombectomy on 03/16/2022.  Today she has been wearing medical grade compression stockings since her discharge.  The swelling is much improved.  She denies any worsening signs symptoms of chest pain or shortness of breath.  Overall she has been progressing well.    Review of Systems  Cardiovascular:  Positive for leg swelling.  All other systems reviewed and are negative.      Objective:   Physical Exam Vitals reviewed.  HENT:     Head: Normocephalic.  Cardiovascular:     Rate and Rhythm: Normal rate.     Pulses: Normal pulses.  Pulmonary:     Effort: Pulmonary effort is normal.  Musculoskeletal:     Right lower leg: Edema present.  Skin:    General: Skin is warm and dry.  Neurological:     Mental Status: She is alert and oriented to person, place, and time.  Psychiatric:        Mood and Affect: Mood normal.        Behavior: Behavior normal.        Thought Content: Thought content normal.        Judgment: Judgment normal.     BP (!) 156/83 (BP Location: Right Arm)   Pulse 71   Resp 16   Ht 6' (1.829 m)   Wt 224 lb (101.6 kg)   BMI 30.38 kg/m   Past Medical History:  Diagnosis Date   Arthritis    toe    Social History   Socioeconomic History   Marital status: Married    Spouse  name: Not on file   Number of children: Not on file   Years of education: Not on file   Highest education level: Not on file  Occupational History   Not on file  Tobacco Use   Smoking status: Never   Smokeless tobacco: Never  Substance and Sexual Activity   Alcohol use: Not Currently   Drug use: Never   Sexual activity: Not on file  Other Topics Concern   Not on file  Social History Narrative   Not on file   Social Determinants of Health   Financial Resource Strain: Not on file  Food Insecurity: Not on file  Transportation Needs: Not on file  Physical Activity: Not on file  Stress: Not on file  Social Connections: Not on file  Intimate Partner Violence: Not on file    Past Surgical History:  Procedure Laterality Date   ABDOMINAL HYSTERECTOMY     ARTHRODESIS METATARSALPHALANGEAL JOINT (MTPJ) Right 02/15/2022   Procedure: ARTHRODESIS METATARSALPHALANGEAL JOINT (MTPJ) - FIRST;  Surgeon: Caroline More, DPM;  Location: [facility];  Service: Podiatry;  Laterality: Right;  Anesthesia: Choice - pop/saph exparil   BREAST EXCISIONAL BIOPSY Right    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 No pertinent family history.  No Known Allergies     Latest Ref Rng & Units 03/16/2022    3:11 AM 03/15/2022    4:50 AM 03/14/2022    7:59 PM  CBC  WBC 4.0 - 10.5 K/uL 4.1  4.4  5.5   Hemoglobin 12.0 - 15.0 g/dL 11.4  11.6  12.2   Hematocrit 36.0 - 46.0 % 34.8  36.1  38.2   Platelets 150 - 400 K/uL 234  233  253       CMP     Component Value Date/Time   NA 142 03/15/2022 0450   K 3.8 03/15/2022 0450   CL 111 03/15/2022 0450   CO2 26 03/15/2022 0450   GLUCOSE 106 (H) 03/15/2022 0450   BUN 11 03/15/2022 0450    CREATININE 0.55 03/15/2022 0450   CALCIUM 8.6 (L) 03/15/2022 0450   PROT 6.8 03/15/2022 0450   ALBUMIN 3.6 03/15/2022 0450   AST 24 03/15/2022 0450   ALT 23 03/15/2022 0450   ALKPHOS 70 03/15/2022 0450   BILITOT 0.4 03/15/2022 0450   GFRNONAA >60 03/15/2022 0450     No results found.     Assessment & Plan:   1. Acute deep vein thrombosis (DVT) of proximal vein of right lower extremity (HCC) Overall the patient is progressing well.  The swelling is much better and is very minimal at this point with use of compression.  We will have the patient return in 3 to 4 weeks with noninvasive studies to determine progression of DVT.  Patient will continue with anticoagulation therapy as there have been no issues.   Current Outpatient Medications on File Prior to Visit  Medication Sig Dispense Refill   acetaminophen (TYLENOL) 500 MG tablet Take 500 mg by mouth every 6 (six) hours as needed.     apixaban (ELIQUIS) 5 MG TABS tablet Take 1 tablet (5 mg total) by mouth 2 (two) times daily. 60 tablet 3   Cholecalciferol (VITAMIN D3) 50 MCG (2000 UT) capsule Take 2,000 Units by mouth daily.     Cranberry 250 MG TABS Take 1 tablet by mouth daily.     docusate sodium (COLACE) 100 MG capsule Take 100 mg by mouth daily.     estradiol (ESTRACE) 0.1 MG/GM vaginal cream estradiol 0.01% (0.1 mg/gram) vaginal cream  INSERT 0.5 GRAM VAGINALLY TWICE A WEEK     loratadine (CLARITIN) 10 MG tablet Take 1 tablet by mouth daily.     vitamin C (ASCORBIC ACID) 500 MG tablet Take 500 mg by mouth daily.     apixaban (ELIQUIS) 5 MG TABS tablet Take 2 tablets (10 mg total) by mouth 2 (two) times daily. 28 tablet 0   No current facility-administered medications on file prior to visit.    There are no Patient Instructions on file for this visit. No follow-ups on file.   Kris Hartmann, NP

## 2022-05-01 ENCOUNTER — Other Ambulatory Visit (INDEPENDENT_AMBULATORY_CARE_PROVIDER_SITE_OTHER): Payer: Self-pay | Admitting: Vascular Surgery

## 2022-05-01 DIAGNOSIS — I82401 Acute embolism and thrombosis of unspecified deep veins of right lower extremity: Secondary | ICD-10-CM

## 2022-05-02 ENCOUNTER — Ambulatory Visit (INDEPENDENT_AMBULATORY_CARE_PROVIDER_SITE_OTHER): Payer: Medicare HMO | Admitting: Nurse Practitioner

## 2022-05-02 ENCOUNTER — Ambulatory Visit (INDEPENDENT_AMBULATORY_CARE_PROVIDER_SITE_OTHER): Payer: Medicare HMO

## 2022-05-02 ENCOUNTER — Encounter (INDEPENDENT_AMBULATORY_CARE_PROVIDER_SITE_OTHER): Payer: Self-pay | Admitting: Nurse Practitioner

## 2022-05-02 VITALS — BP 165/78 | HR 74 | Resp 16 | Wt 225.6 lb

## 2022-05-02 DIAGNOSIS — I824Y1 Acute embolism and thrombosis of unspecified deep veins of right proximal lower extremity: Secondary | ICD-10-CM | POA: Diagnosis not present

## 2022-05-02 DIAGNOSIS — I82401 Acute embolism and thrombosis of unspecified deep veins of right lower extremity: Secondary | ICD-10-CM | POA: Diagnosis not present

## 2022-05-15 ENCOUNTER — Emergency Department: Payer: Medicare HMO

## 2022-05-15 ENCOUNTER — Emergency Department
Admission: EM | Admit: 2022-05-15 | Discharge: 2022-05-15 | Disposition: A | Discharge status: Home / Self Care | Payer: Medicare HMO | Attending: Emergency Medicine | Admitting: Emergency Medicine

## 2022-05-15 ENCOUNTER — Other Ambulatory Visit: Payer: Self-pay

## 2022-05-15 DIAGNOSIS — R4701 Aphasia: Secondary | ICD-10-CM | POA: Diagnosis not present

## 2022-05-15 DIAGNOSIS — Z20822 Contact with and (suspected) exposure to covid-19: Secondary | ICD-10-CM | POA: Diagnosis not present

## 2022-05-15 DIAGNOSIS — I82409 Acute embolism and thrombosis of unspecified deep veins of unspecified lower extremity: Secondary | ICD-10-CM | POA: Diagnosis not present

## 2022-05-15 DIAGNOSIS — G459 Transient cerebral ischemic attack, unspecified: Secondary | ICD-10-CM | POA: Insufficient documentation

## 2022-05-15 DIAGNOSIS — Z7901 Long term (current) use of anticoagulants: Secondary | ICD-10-CM | POA: Insufficient documentation

## 2022-05-15 DIAGNOSIS — E785 Hyperlipidemia, unspecified: Secondary | ICD-10-CM | POA: Diagnosis not present

## 2022-05-15 DIAGNOSIS — R531 Weakness: Secondary | ICD-10-CM | POA: Diagnosis present

## 2022-05-15 LAB — URINALYSIS, ROUTINE W REFLEX MICROSCOPIC
Bilirubin Urine: NEGATIVE
Glucose, UA: NEGATIVE mg/dL
Hgb urine dipstick: NEGATIVE
Ketones, ur: NEGATIVE mg/dL
Leukocytes,Ua: NEGATIVE
Nitrite: NEGATIVE
Protein, ur: NEGATIVE mg/dL
Specific Gravity, Urine: 1.004 — ABNORMAL LOW (ref 1.005–1.030)
pH: 7 (ref 5.0–8.0)

## 2022-05-15 LAB — CBC
HCT: 39.9 % (ref 36.0–46.0)
Hemoglobin: 12.8 g/dL (ref 12.0–15.0)
MCH: 30.9 pg (ref 26.0–34.0)
MCHC: 32.1 g/dL (ref 30.0–36.0)
MCV: 96.4 fL (ref 80.0–100.0)
Platelets: 256 10*3/uL (ref 150–400)
RBC: 4.14 MIL/uL (ref 3.87–5.11)
RDW: 12.7 % (ref 11.5–15.5)
WBC: 5.5 10*3/uL (ref 4.0–10.5)
nRBC: 0 % (ref 0.0–0.2)

## 2022-05-15 LAB — LIPID PANEL
Cholesterol: 242 mg/dL — ABNORMAL HIGH (ref 0–200)
HDL: 48 mg/dL (ref >40)
LDL Cholesterol: 149 mg/dL — ABNORMAL HIGH (ref 0–99)
Total CHOL/HDL Ratio: 5 RATIO
Triglycerides: 226 mg/dL — ABNORMAL HIGH (ref <150)
VLDL: 45 mg/dL — ABNORMAL HIGH (ref 0–40)

## 2022-05-15 LAB — COMPREHENSIVE METABOLIC PANEL
ALT: 17 U/L (ref 0–44)
AST: 23 U/L (ref 15–41)
Albumin: 4.6 g/dL (ref 3.5–5.0)
Alkaline Phosphatase: 53 U/L (ref 38–126)
Anion gap: 5 (ref 5–15)
BUN: 12 mg/dL (ref 8–23)
CO2: 27 mmol/L (ref 22–32)
Calcium: 9.2 mg/dL (ref 8.9–10.3)
Chloride: 109 mmol/L (ref 98–111)
Creatinine, Ser: 0.78 mg/dL (ref 0.44–1.00)
GFR, Estimated: >60 mL/min (ref >60)
Glucose, Bld: 104 mg/dL — ABNORMAL HIGH (ref 70–99)
Potassium: 4.1 mmol/L (ref 3.5–5.1)
Sodium: 141 mmol/L (ref 135–145)
Total Bilirubin: 0.7 mg/dL (ref 0.3–1.2)
Total Protein: 7.9 g/dL (ref 6.5–8.1)

## 2022-05-15 LAB — URINE DRUG SCREEN, QUALITATIVE (ARMC ONLY)
Amphetamines, Ur Screen: NOT DETECTED
Barbiturates, Ur Screen: NOT DETECTED
Benzodiazepine, Ur Scrn: NOT DETECTED
Cannabinoid 50 Ng, Ur ~~LOC~~: NOT DETECTED
Cocaine Metabolite,Ur ~~LOC~~: NOT DETECTED
MDMA (Ecstasy)Ur Screen: NOT DETECTED
Methadone Scn, Ur: NOT DETECTED
Opiate, Ur Screen: NOT DETECTED
Phencyclidine (PCP) Ur S: NOT DETECTED
Tricyclic, Ur Screen: NOT DETECTED

## 2022-05-15 LAB — RESP PANEL BY RT-PCR (FLU A&B, COVID) ARPGX2
Influenza A by PCR: NEGATIVE
Influenza B by PCR: NEGATIVE
SARS Coronavirus 2 by RT PCR: NEGATIVE

## 2022-05-15 LAB — DIFFERENTIAL
Abs Immature Granulocytes: 0.01 10*3/uL (ref 0.00–0.07)
Basophils Absolute: 0 10*3/uL (ref 0.0–0.1)
Basophils Relative: 1 %
Eosinophils Absolute: 0.1 10*3/uL (ref 0.0–0.5)
Eosinophils Relative: 2 %
Immature Granulocytes: 0 %
Lymphocytes Relative: 25 %
Lymphs Abs: 1.3 10*3/uL (ref 0.7–4.0)
Monocytes Absolute: 0.5 10*3/uL (ref 0.1–1.0)
Monocytes Relative: 9 %
Neutro Abs: 3.5 10*3/uL (ref 1.7–7.7)
Neutrophils Relative %: 63 %

## 2022-05-15 LAB — CBG MONITORING, ED: Glucose-Capillary: 104 mg/dL — ABNORMAL HIGH (ref 70–99)

## 2022-05-15 LAB — PROTIME-INR
INR: 1 (ref 0.8–1.2)
Prothrombin Time: 12.8 seconds (ref 11.4–15.2)

## 2022-05-15 LAB — APTT: aPTT: 31 seconds (ref 24–36)

## 2022-05-15 LAB — ETHANOL: Alcohol, Ethyl (B): <10 mg/dL (ref <10)

## 2022-05-15 MED ORDER — IOHEXOL 350 MG/ML SOLN
75.0000 mL | Freq: Once | INTRAVENOUS | Status: AC | PRN
  Administered 2022-05-15: 75 mL via INTRAVENOUS

## 2022-05-15 MED ORDER — ATORVASTATIN CALCIUM 20 MG PO TABS
80.0000 mg | ORAL_TABLET | Freq: Once | ORAL | Status: AC
  Administered 2022-05-15: 80 mg via ORAL
  Filled 2022-05-15: qty 4

## 2022-05-15 MED ORDER — ATORVASTATIN CALCIUM 80 MG PO TABS: 80.0000 mg | ORAL_TABLET | Freq: Every day | ORAL | 0 refills | Status: AC

## 2022-05-15 MED ORDER — APIXABAN 5 MG PO TABS
5.0000 mg | ORAL_TABLET | Freq: Once | ORAL | Status: AC
Start: 2022-05-15 — End: 2022-05-15
  Administered 2022-05-15: 5 mg via ORAL
  Filled 2022-05-15: qty 1

## 2022-05-15 NOTE — ED Provider Notes (Addendum)
[facility] Provider Note    Event Date/Time   First MD Initiated Contact with Patient 05/15/22 1541     (approximate)   History   Weakness   HPI  Gail Roberts is a 66 y.o. female  with PMHx DVT on Eliquis here with speech difficulty and weakness. At around 3 PM, pt developed sudden onset R sided weakness and aphasia. Pt states she was writing and the worse just wouldn't come out. She then tried to talk to her husband and thought she was saying I need to go to the hospital, but per husband the worse made no sense. She also had subtle R hand/arm weakness and R facial asymmetry. Presents now for evaluation. Sx improved dramatically now, and speech is back to baseline.No h/o TIA or stroke.       Physical Exam   Triage Vital Signs: ED Triage Vitals [05/15/22 1523]  Enc Vitals Group     BP (!) 198/137     Pulse Rate 77     Resp 18     Temp      Temp src      SpO2 99 %     Weight      Height      Head Circumference      Peak Flow      Pain Score      Pain Loc      Pain Edu?      Excl. in [place]?     Most recent vital signs: Vitals:   05/15/22 2007 05/15/22 2108  BP: (!) 140/74 138/70  Pulse: 70 74  Resp: 18 18  Temp:  98.2 F (36.8 C)  SpO2: 98% 99%     General: Awake, no distress.  CV:  Good peripheral perfusion. RRR. No murmurs. Resp:  Normal effort.  Abd:  No distention. No tenderness. Other:  Subtle R NLF. CN otherwise intact. Speech normal. Strength 5/5 bl UE and LE. Normal sensation to light touch. Gait deferred.   ED Results / Procedures / Treatments   Labs (all labs ordered are listed, but only abnormal results are displayed) Labs Reviewed  COMPREHENSIVE METABOLIC PANEL - Abnormal; Notable for the following components:      Result Value   Glucose, Bld 104 (*)    All other components within normal limits  URINALYSIS, ROUTINE W REFLEX MICROSCOPIC - Abnormal; Notable for the following components:   Color, Urine STRAW  (*)    APPearance CLEAR (*)    Specific Gravity, Urine 1.004 (*)    All other components within normal limits  LIPID PANEL - Abnormal; Notable for the following components:   Cholesterol 242 (*)    Triglycerides 226 (*)    VLDL 45 (*)    LDL Cholesterol 149 (*)    All other components within normal limits  CBG MONITORING, ED - Abnormal; Notable for the following components:   Glucose-Capillary 104 (*)    All other components within normal limits  RESP PANEL BY RT-PCR (FLU A&B, COVID) ARPGX2  ETHANOL  PROTIME-INR  APTT  CBC  DIFFERENTIAL  URINE DRUG SCREEN, QUALITATIVE ([facility] ONLY)     EKG Normal sinus rhythm< VR 77. PR 135, QRS 98, QTc 447. Noa cute ST elevations or depressions. No ischemia or infarct.   RADIOLOGY CT Head: NAICA CT Angio Head/Neck: No large vessel occlusion, patient transcortical inferolaterally, no significant stenosis   I also independently reviewed and agree with radiologist interpretations.   PROCEDURES:  Critical  Care performed: No  .1-3 Lead EKG Interpretation  Performed by: Duffy Bruce, MD Authorized by: Duffy Bruce, MD     Interpretation: normal     ECG rate:  70-90   ECG rate assessment: normal     Rhythm: sinus rhythm     Ectopy: none     Conduction: normal   Comments:     Indication: TIA/CVA     MEDICATIONS ORDERED IN ED: Medications  iohexol (OMNIPAQUE) 350 MG/ML injection 75 mL (75 mLs Intravenous Contrast Given 05/15/22 1609)  apixaban (ELIQUIS) tablet 5 mg (5 mg Oral Given 05/15/22 2116)  atorvastatin (LIPITOR) tablet 80 mg (80 mg Oral Given 05/15/22 2116)     IMPRESSION / MDM / ASSESSMENT AND PLAN / ED COURSE  I reviewed the triage vital signs and the nursing notes.                               The patient is on the cardiac monitor to evaluate for evidence of arrhythmia and/or significant heart rate changes.   Ddx:  Differential includes the following, with pertinent life- or limb-threatening emergencies  considered:  TIA, CVA, partial seizure, intracranial mass/lesion, ICH, atypical migraine  Patient's presentation is most consistent with acute presentation with potential threat to life or bodily function.  MDM:  66 yo F here with suspected TIA. Sx now essentially resolved. Pt initially activated as a CODE STROKE and taken for CT which is negative. Dr. Leonel Ramsay at bedside and evaluated immediately. Pt is already on anticoagulation for h/o DVT. He recommended CT Angio and MRI, which were obtained and reviewed. CT Angio shows no high grade obstruction or stenosis. MRI negative. Labs otherwise reassuring - no leukocytosis, lytes wnl. EKG nonischemic, no ischemia. VSS. UDS obtained in setting of altered mental status of unknown origin and is negative.  Based on discussion with pt and Dr. Leonel Ramsay, plan will be to d/c with outpt neuro f/u for TIA as she is already optimally anticoagulated. She does have a h/o hyperlipidemia/borderline high cholesterol, so we discussed risks/benefits of statin and will start on high dose statin for now pending discussion with PCP. Return precautions given.   MEDICATIONS GIVEN IN ED: Medications  iohexol (OMNIPAQUE) 350 MG/ML injection 75 mL (75 mLs Intravenous Contrast Given 05/15/22 1609)  apixaban (ELIQUIS) tablet 5 mg (5 mg Oral Given 05/15/22 2116)  atorvastatin (LIPITOR) tablet 80 mg (80 mg Oral Given 05/15/22 2116)     Consults:  Neurology Dr. Leonel Ramsay   EMR reviewed       FINAL CLINICAL IMPRESSION(S) / ED DIAGNOSES   Final diagnoses:  TIA (transient ischemic attack)     Rx / DC Orders   ED Discharge Orders          Ordered    atorvastatin (LIPITOR) 80 MG tablet  Daily        05/15/22 2116             Note:  This document was prepared using Dragon voice recognition software and may include unintentional dictation errors.   Duffy Bruce, MD 05/16/22 Areta Haber    Duffy Bruce, MD 06/26/22 [phone]

## 2022-05-15 NOTE — Discharge Instructions (Signed)
For now, start the atorvastatin 80 mg.   Call your doctor to discuss your ER visit. We recommend a fasting lipid panel completed within the next week.  Let your doctor know about your TIA, and the plan to start atorvastatin based on your risk factors. You should let him know you were prescribed this and arrange appropriate follow-up.  Return to the ER immediately if symptoms return

## 2022-05-15 NOTE — ED Triage Notes (Signed)
Pt here with right side weakness, slurred speech, and trouble writing. Pt's husband states symptoms started at 1500 and she called him to tell him she needed to come to the ED. Speech symptoms have resolved.

## 2022-05-15 NOTE — Consult Note (Signed)
Neurology Consultation Reason for Consult: Right-sided weakness and aphasia Referring Physician: Ellender Hose, C  CC: Right-sided weakness and aphasia  History is obtained from: Patient, husband  HPI: Gail Roberts is a 66 y.o. female who was in her normal state of health until 3 PM at which point she had sudden onset of right-sided weakness and aphasia.  She states that she was writing and suddenly the words just would not come out.  She then tried to talk to her husband and was speaking gibberish and he noticed that her right side was slightly weak.  Due to the symptoms he drove her to the emergency department where a code stroke was activated.  Of note, she was recently diagnosed with DVT, and is on Eliquis.  LKW: 3 PM tpa given?: no, resolution to mild symptoms Premorbid modified rankin scale: Zero NIHSS: 1  Past Medical History:  Diagnosis Date   Arthritis    toe   DVT (deep venous thrombosis) ([place])      Family History  Problem Relation Age of Onset   Colon cancer Mother    Heart disease Father    Hyperlipidemia Father    Hypertension Father    Lung cancer Father    Hypertension Sister    Hyperlipidemia Sister    Colon cancer Sister    Arthritis/Rheumatoid Sister    Colon cancer Maternal Grandfather    Heart disease Paternal Grandmother    Heart attack Paternal Grandmother      Social History:  reports that she has never smoked. She has never used smokeless tobacco. She reports that she does not currently use alcohol. She reports that she does not use drugs.   Exam: Current vital signs: BP (!) 198/137 (BP Location: Left Arm)   Pulse 77   Resp 18   SpO2 99%  Vital signs in last 24 hours: Pulse Rate:  [77] 77 (08/08 1523) Resp:  [18] 18 (08/08 1523) BP: (198)/(137) 198/137 (08/08 1523) SpO2:  [99 %] 99 % (08/08 1523)   Physical Exam  Constitutional: Appears well-developed and well-nourished.  Psych: Affect appropriate to situation Eyes: No scleral  injection HENT: No OP obstruction MSK: no joint deformities.  Cardiovascular: Normal rate and regular rhythm.  Respiratory: Effort normal, non-labored breathing GI: Soft.  No distension. There is no tenderness.  Skin: WDI  Neuro: Mental Status: Patient is awake, alert, oriented to person, place, month, year, and situation. Patient is able to give a clear and coherent history. No signs of aphasia or neglect Cranial Nerves: II: Visual Fields are full. Pupils are equal, round, and reactive to light.   III,IV, VI: EOMI without ptosis or diploplia.  V: Facial sensation is symmetric to temperature VII: Facial movement is symmetric.  VIII: hearing is intact to voice X: Uvula elevates symmetrically XI: Shoulder shrug is symmetric. XII: tongue is midline without atrophy or fasciculations.  Motor: Tone is normal. Bulk is normal. 5/5 strength was present in all four extremities.  Sensory: Sensation is symmetric to light touch and temperature in the arms and legs. Cerebellar: FNF and HKS are intact bilaterally   I have reviewed labs in epic and the results pertinent to this consultation are: CBG 104 24  I have reviewed the images obtained: CT head-negative  Impression: 66 year old female who presents with right-sided weakness and aphasia which is mostly resolved.  At this point, she is not a candidate for TNK due to resolution of symptoms.  She is already anticoagulated with Eliquis, and will be for  multiple months due to DVT.  My suspicion with her improvement is that this does represent TIA, she will need evaluation with CTA stenosis, and an MRI to make sure that she does not have injury.  If these are negative, the remaining of her TIA work-up would be a lipid panel which could be accomplished as an outpatient.  I would also proceed with echo and cardiac monitoring, but given that she is already anticoagulated, this could also be done as an outpatient.  It would not change acute  management, but could change long term anticoagulation management.   Recommendations: 1) MRI brain 2) CTA head and neck 3) If negative, then would follow-up  as outpatient for BP management, goal A1C < 7, Goal LDL < 70, and echo/cardiac monitoring 4) No need to add antiplatelets, but if eliquis is stopped, would start daily aspirin 5) If carotid stenosis is found, would consult with vascular surgery.    Roland Rack, MD [facility] [phone]  If 7pm- 7am, please page neurology on call as listed in [place].

## 2022-05-15 NOTE — ED Notes (Signed)
Code  stroke  called  to  [facility] 

## 2022-05-15 NOTE — Progress Notes (Signed)
   05/15/22 1600  Clinical Encounter Type  Visited With Patient and family together  Visit Type Initial  Referral From Nurse  Consult/Referral To Chaplain   Chaplain responded to code stroke. Chaplain provided compassionate presence and reflective listening as patient and husband spoke about health challenges. Patient and spouse appreciated Chaplain visit.

## 2022-05-15 NOTE — ED Provider Triage Note (Signed)
Emergency Medicine Provider Triage Evaluation Note  Gail Roberts, a 66 y.o. female  was evaluated in triage.  Pt complains of right-sided weakness, slurred speech, and difficulty with expression both spoken and written.  Patient started to have symptoms about 3:00 this afternoon.  She called her husband on the phone, to tell him that she felt like she needed to come to the ED.  He reports that her speech was slurred and garbled at that time.  Symptoms lasted for approximately 15 minutes.  Patient presents to the ED for evaluation alert and oriented x 4.  She is able to talk in complete sentences and follow commands without difficulty.  No gross weakness or paralysis is appreciated.  Review of Systems  Positive: Slurred speech, right-sided weakness, and expressive aphasia Negative: Head injury, LOC  Physical Exam  BP (!) 198/137 (BP Location: Left Arm)   Pulse 77   Resp 18   SpO2 99%  Gen:   Awake, no distress NAD A&O x 4.  No gross abnormalities noted Resp:  Normal effort CTA MSK:   Moves extremities without difficulty  Other:    Medical Decision Making  Medically screening exam initiated at 3:28 PM.  Appropriate orders placed.  Laurin Coder Manke was informed that the remainder of the evaluation will be completed by another provider, this initial triage assessment does not replace that evaluation, and the importance of remaining in the ED until their evaluation is complete.  Patient to the ED for evaluation of sudden onset of right-sided weakness with associated slurred speech and expressive aphasia.  Patient's symptoms lasted for approximately 15 minutes according to her husband was present at bedside.   Melvenia Needles, PA-C 05/15/22 1823

## 2022-05-20 NOTE — Progress Notes (Signed)
Subjective:    Patient ID: Gail Roberts, female    DOB: 08/18/1956, 66 y.o.   MRN: [id number] Chief Complaint  Patient presents with   Follow-up    Ultrasound follow up    Gail Roberts is a 66 year old female with a previous medical history of right toe surgery on 02/15/2022.  Following the surgery the patient was nonweightbearing.  She was at her follow-up visit to determine her weightbearing status and it was noticed that her right lower extremity was significantly more swollen than her left.  The patient notes that the swelling began several days prior to this.  There was concern for a DVT and noninvasive study showed a DVT extending from the calf to the femoral veins.  The patient underwent a right lower extremity thrombectomy on 03/16/2022.  Today she has been wearing medical grade compression stockings since her discharge.  The swelling is much improved, but not resolved.  She denies any worsening signs symptoms of chest pain or shortness of breath.  Overall she has been progressing well.  Today noninvasive studies show improved findings from previous examination.  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    Review of Systems  Cardiovascular:  Positive for leg swelling.  All other systems reviewed and are negative.      Objective:   Physical Exam Vitals reviewed.  HENT:     Head: Normocephalic.  Cardiovascular:     Rate and Rhythm: Normal rate.  Pulmonary:     Effort: Pulmonary effort is normal.  Musculoskeletal:     Right lower leg: Edema present.  Skin:    General: Skin is warm and dry.  Neurological:     Mental Status: She is alert and oriented to person, place, and time.  Psychiatric:        Mood and Affect: Mood normal.        Behavior: Behavior normal.        Thought Content: Thought content normal.        Judgment: Judgment normal.     BP (!) 165/78 (BP Location: Left Arm)   Pulse 74   Resp 16   Wt 225 lb 9.6 oz (102.3 kg)    BMI 30.60 kg/m   Past Medical History:  Diagnosis Date   Arthritis    toe   DVT (deep venous thrombosis) ([facility])     Social History   Socioeconomic History   Marital status: Married    Spouse name: Not on file   Number of children: Not on file   Years of education: Not on file   Highest education level: Not on file  Occupational History   Not on file  Tobacco Use   Smoking status: Never   Smokeless tobacco: Never  Substance and Sexual Activity   Alcohol use: Not Currently   Drug use: Never   Sexual activity: Not on file  Other Topics Concern   Not on file  Social History Narrative   Not on file   Social Determinants of Health   Financial Resource Strain: Not on file  Food Insecurity: Not on file  Transportation Needs: Not on file  Physical Activity: Not on file  Stress: Not on file  Social Connections: Not on file  Intimate Partner Violence: Not on file    Past Surgical History:  Procedure Laterality Date   ABDOMINAL HYSTERECTOMY     ARTHRODESIS METATARSALPHALANGEAL JOINT (MTPJ) Right 02/15/2022   Procedure: ARTHRODESIS METATARSALPHALANGEAL JOINT (MTPJ) - FIRST;  Surgeon: Caroline More, DPM;  Location: [facility];  Service: Podiatry;  Laterality: Right;  Anesthesia: Choice - pop/saph exparil   BREAST EXCISIONAL BIOPSY Right    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   PERIPHERAL VASCULAR THROMBECTOMY Right 03/16/2022   Procedure: PERIPHERAL VASCULAR THROMBECTOMY;  Surgeon: Katha Cabal, MD;  Location: [facility];  Service: Cardiovascular;  Laterality: Right;   PILONIDAL CYST EXCISION  1976    Family History  Problem Relation Age of Onset   Colon cancer Mother    Heart disease Father    Hyperlipidemia Father    Hypertension Father    Lung cancer Father    Hypertension Sister    Hyperlipidemia Sister    Colon cancer Sister    Arthritis/Rheumatoid Sister    Colon cancer Maternal Grandfather    Heart disease Paternal  Grandmother    Heart attack Paternal Grandmother     Allergies  Allergen Reactions   Silicone Rash       Latest Ref Rng & Units 05/15/2022    3:49 PM 03/16/2022    3:11 AM 03/15/2022    4:50 AM  CBC  WBC 4.0 - 10.5 K/uL 5.5  4.1  4.4   Hemoglobin 12.0 - 15.0 g/dL 12.8  11.4  11.6   Hematocrit 36.0 - 46.0 % 39.9  34.8  36.1   Platelets 150 - 400 K/uL 256  234  233       CMP     Component Value Date/Time   NA 141 05/15/2022 1549   K 4.1 05/15/2022 1549   CL 109 05/15/2022 1549   CO2 27 05/15/2022 1549   GLUCOSE 104 (H) 05/15/2022 1549   BUN 12 05/15/2022 1549   CREATININE 0.78 05/15/2022 1549   CALCIUM 9.2 05/15/2022 1549   PROT 7.9 05/15/2022 1549   ALBUMIN 4.6 05/15/2022 1549   AST 23 05/15/2022 1549   ALT 17 05/15/2022 1549   ALKPHOS 53 05/15/2022 1549   BILITOT 0.7 05/15/2022 1549   GFRNONAA >60 05/15/2022 1549     No results found.     Assessment & Plan:   1. Acute deep vein thrombosis (DVT) of proximal vein of right lower extremity ([place]) Patient will remain on Eliquis.  She still continues to have some swelling of the right lower extremity.  She is advised to continue to utilize medical grade compression with elevation.  Patient return in approximately 8 weeks.   Current Outpatient Medications on File Prior to Visit  Medication Sig Dispense Refill   acetaminophen (TYLENOL) 500 MG tablet Take 500 mg by mouth every 6 (six) hours as needed.     apixaban (ELIQUIS) 5 MG TABS tablet Take 1 tablet (5 mg total) by mouth 2 (two) times daily. 60 tablet 3   Cholecalciferol (VITAMIN D3) 50 MCG (2000 UT) capsule Take 2,000 Units by mouth daily.     Cranberry 250 MG TABS Take 1 tablet by mouth daily.     estradiol (ESTRACE) 0.1 MG/GM vaginal cream estradiol 0.01% (0.1 mg/gram) vaginal cream  INSERT 0.5 GRAM VAGINALLY TWICE A WEEK     loratadine (CLARITIN) 10 MG tablet Take 1 tablet by mouth daily.     vitamin C (ASCORBIC ACID) 500 MG tablet Take 500 mg by mouth daily.      apixaban (ELIQUIS) 5 MG TABS tablet Take 2 tablets (10 mg total) by mouth 2 (two) times daily. 28 tablet 0   docusate sodium (COLACE) 100 MG  capsule Take 100 mg by mouth daily.     No current facility-administered medications on file prior to visit.    There are no Patient Instructions on file for this visit. No follow-ups on file.   Kris Hartmann, NP

## 2022-05-21 ENCOUNTER — Encounter (INDEPENDENT_AMBULATORY_CARE_PROVIDER_SITE_OTHER): Payer: Self-pay | Admitting: Nurse Practitioner

## 2022-06-29 ENCOUNTER — Other Ambulatory Visit (INDEPENDENT_AMBULATORY_CARE_PROVIDER_SITE_OTHER): Payer: Self-pay | Admitting: Nurse Practitioner

## 2022-06-29 DIAGNOSIS — I824Y1 Acute embolism and thrombosis of unspecified deep veins of right proximal lower extremity: Secondary | ICD-10-CM

## 2022-07-02 ENCOUNTER — Encounter (INDEPENDENT_AMBULATORY_CARE_PROVIDER_SITE_OTHER): Payer: Self-pay | Admitting: Vascular Surgery

## 2022-07-02 ENCOUNTER — Ambulatory Visit (INDEPENDENT_AMBULATORY_CARE_PROVIDER_SITE_OTHER): Payer: Medicare HMO

## 2022-07-02 ENCOUNTER — Ambulatory Visit (INDEPENDENT_AMBULATORY_CARE_PROVIDER_SITE_OTHER): Payer: Medicare HMO | Admitting: Vascular Surgery

## 2022-07-02 DIAGNOSIS — I824Y1 Acute embolism and thrombosis of unspecified deep veins of right proximal lower extremity: Secondary | ICD-10-CM | POA: Diagnosis not present

## 2022-07-02 NOTE — Progress Notes (Signed)
MRN : [id number]  Gail Roberts is a 66 y.o. ([birth year]) female who presents with chief complaint of legs hurt and swell.  History of Present Illness:   The patient presents to the office for follow-up evaluation status post right lower extremity thrombectomy.  Thrombectomy was performed at at [facility] on 03/16/2022.    The initial symptoms were pain and swelling in the lower extremity.  The patient notes the leg is much improved although there is still some pain it is quite minimal and the swelling with prolonged dependency is also quite minimal.  Symptoms are much better with elevation.    The patient has been using compression therapy on a routine basis.  The patient is on anticoagulation.  No SOB or pleuritic chest pains.  No cough or hemoptysis.  No blood per rectum or blood in any sputum.  No excessive bruising per the patient.   No recent shortening of the patient's walking distance or new symptoms consistent with claudication.  No history of rest pain symptoms. No new ulcers or wounds of the lower extremities have occurred.  The patient denies amaurosis fugax or recent TIA symptoms. There are no recent neurological changes noted. No recent episodes of angina or shortness of breath documented.  Duplex ultrasound of the venous system of the right lower extremity demonstrates marked improvement compared to the previous study which was dated May 02, 2022.  There is now full compressibility and phasic flow within the entire superficial femoral.  There is some mild chronic changes noted in the common femoral saphenofemoral junction and deep femoral, but this appears quite minimal.  No outpatient medications have been marked as taking for the 07/02/22 encounter (Appointment) with Delana Meyer, Dolores Lory, MD.    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   PERIPHERAL VASCULAR THROMBECTOMY Right 03/16/2022   Procedure: PERIPHERAL VASCULAR THROMBECTOMY;  Surgeon: Katha Cabal, MD;  Location: [place] [facility];  Service: Cardiovascular;  Laterality: Right;   PILONIDAL CYST EXCISION  1976    Social History Social History   Tobacco Use   Smoking status: Never   Smokeless tobacco: Never  Substance Use Topics   Alcohol use: Not Currently   Drug use: Never    Family History Family History  Problem Relation Age of Onset   Colon cancer Mother    Heart disease Father    Hyperlipidemia Father    Hypertension Father    Lung cancer Father    Hypertension Sister    Hyperlipidemia Sister    Colon cancer Sister    Arthritis/Rheumatoid Sister    Colon cancer Maternal Grandfather    Heart disease Paternal Grandmother    Heart attack Paternal Grandmother     Allergies  Allergen Reactions   Silicone Rash     REVIEW OF SYSTEMS (Negative unless checked)  Constitutional: '[]'$ Weight loss  '[]'$ Fever  '[]'$ Chills Cardiac: '[]'$ Chest pain   '[]'$ Chest pressure   '[]'$   Palpitations   '[]'$ Shortness of breath when laying flat   '[]'$ Shortness of breath with exertion. Vascular:  '[]'$ Pain in legs with walking   '[x]'$ Pain in legs at rest  '[]'$ History of DVT   '[]'$ Phlebitis   '[x]'$ Swelling in legs   '[]'$ Varicose veins   '[]'$ Non-healing ulcers Pulmonary:   '[]'$ Uses home oxygen   '[]'$ Productive cough   '[]'$ Hemoptysis   '[]'$ Wheeze  '[]'$ COPD   '[]'$ Asthma Neurologic:  '[]'$ Dizziness   '[]'$ Seizures   '[]'$ History of stroke   '[]'$ History of TIA  '[]'$ Aphasia   '[]'$ Vissual changes   '[]'$ Weakness or numbness in arm   '[]'$ Weakness or numbness in  leg Musculoskeletal:   '[]'$ Joint swelling   '[]'$ Joint pain   '[]'$ Low back pain Hematologic:  '[]'$ Easy bruising  '[]'$ Easy bleeding   '[]'$ Hypercoagulable state   '[]'$ Anemic Gastrointestinal:  '[]'$ Diarrhea   '[]'$ Vomiting  '[]'$ Gastroesophageal reflux/heartburn   '[]'$ Difficulty swallowing. Genitourinary:  '[]'$ Chronic kidney disease   '[]'$ Difficult urination  '[]'$ Frequent urination   '[]'$ Blood in urine Skin:  '[]'$ Rashes   '[]'$ Ulcers  Psychological:  '[]'$ History of anxiety   '[]'$  History of major depression.  Physical Examination  There were no vitals filed for this visit. There is no height or weight on file to calculate BMI. Gen: WD/WN, NAD Head: [place]/AT, No temporalis wasting.  Ear/Nose/Throat: Hearing grossly intact, nares w/o erythema or drainage, pinna without lesions Eyes: PER, EOMI, sclera nonicteric.  Neck: Supple, no gross masses.  No JVD.  Pulmonary:  Good air movement, no audible wheezing, no use of accessory muscles.  Cardiac: RRR, precordium not hyperdynamic. Vascular:  scattered varicosities present bilaterally.  Mild venous stasis changes to the legs bilaterally.  Trace edema right leg Vessel Right Left  Radial Palpable Palpable  Gastrointestinal: soft, non-distended. No guarding/no peritoneal signs.  Musculoskeletal: M/S 5/5 throughout.  No deformity.  Neurologic: CN 2-12 intact. Pain and light touch intact in extremities.  Symmetrical.  Speech is fluent. Motor exam as listed above. Psychiatric: Judgment intact, Mood & affect appropriate for pt's clinical situation. Dermatologic: Venous rashes no ulcers noted.  No changes consistent with cellulitis. Lymph : No lichenification or skin changes of chronic lymphedema.  CBC Lab Results  Component Value Date   WBC 5.5 05/15/2022   HGB 12.8 05/15/2022   HCT 39.9 05/15/2022   MCV 96.4 05/15/2022   PLT 256 05/15/2022    BMET    Component Value Date/Time   NA 141 05/15/2022 1549   K 4.1 05/15/2022 1549   CL 109 05/15/2022 1549   CO2 27 05/15/2022 1549    GLUCOSE 104 (H) 05/15/2022 1549   BUN 12 05/15/2022 1549   CREATININE 0.78 05/15/2022 1549   CALCIUM 9.2 05/15/2022 1549   GFRNONAA >60 05/15/2022 1549   CrCl cannot be calculated (Patient's most recent lab result is older than the maximum 21 days allowed.).  COAG Lab Results  Component Value Date   INR 1.0 05/15/2022   INR 1.0 03/14/2022    Radiology No results found.   Assessment/Plan 1. Acute deep vein thrombosis (DVT) of proximal vein of right lower extremity (HCC) Recommend:   No surgery or intervention at this point in time.  IVC filter is not indicated at present.  Patient's duplex ultrasound of the venous system shows DVT from the popliteal to the femoral veins.  The patient is initiated on anticoagulation which she has been doing quite well.  She will continue anticoagulation for a full 6 months which would bring [place] to December.  She will follow-up with me in the first week of  December no indication to repeat the ultrasound given the marked improvement in the study done today compared with the previous study.  In December we will plan to discontinue anticoagulation.  Elevation was stressed, such as the use of a recliner.  I have discussed  DVT and post phlebitic changes such as swelling and why it  causes symptoms such as pain.  The patient should continue to wear graduated compression stockings.  The compression should be worn on a daily basis. The patient should wearing the stockings first thing in the morning and removing them in the evening. The patient should not to sleep in the stockings.  In addition, behavioral modification including elevation during the day and avoidance of prolonged dependency will be initiated.    The patient will continue anticoagulation for now as there have not been any problems or complications at this point.   - VAS [place] LOWER EXTREMITY VENOUS (DVT)    Hortencia Pilar, MD  07/02/2022 2:15 PM

## 2022-07-03 ENCOUNTER — Encounter (INDEPENDENT_AMBULATORY_CARE_PROVIDER_SITE_OTHER): Payer: Self-pay | Admitting: Vascular Surgery

## 2022-08-06 ENCOUNTER — Encounter (INDEPENDENT_AMBULATORY_CARE_PROVIDER_SITE_OTHER): Payer: Self-pay

## 2022-09-08 NOTE — Progress Notes (Signed)
MRN : [id number]  Gail Roberts is a 66 y.o. (1956/01/21) female who presents with chief complaint of legs hurt and swell.  History of Present Illness:   The patient presents to the office for follow-up evaluation status post right lower extremity thrombectomy.  Thrombectomy was performed at at [facility] on 03/16/2022.     The initial symptoms were pain and swelling in the lower extremity.   The patient notes the leg is much improved although there is still some pain it is quite minimal and the swelling with prolonged dependency is also quite minimal.  Symptoms are much better with elevation.     The patient has been using compression therapy on a routine basis.   The patient is on anticoagulation.   No SOB or pleuritic chest pains.  No cough or hemoptysis.   No blood per rectum or blood in any sputum.  No excessive bruising per the patient.    No recent shortening of the patient's walking distance or new symptoms consistent with claudication.  No history of rest pain symptoms. No new ulcers or wounds of the lower extremities have occurred.   The patient denies amaurosis fugax or recent TIA symptoms. There are no recent neurological changes noted. No recent episodes of angina or shortness of breath documented.   Duplex ultrasound of the venous system of the right lower extremity done 07/02/2022 demonstrates marked improvement compared to the previous study which was dated May 02, 2022.  There is now full compressibility and phasic flow within the entire superficial femoral.  There is some mild chronic changes noted in the common femoral saphenofemoral junction and deep femoral, but this appears quite minimal.  No outpatient medications have been marked as taking for the 09/10/22 encounter (Appointment) with Gilda Crease, Latina Craver, MD.    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   PERIPHERAL VASCULAR THROMBECTOMY Right 03/16/2022   Procedure: PERIPHERAL VASCULAR THROMBECTOMY;  Surgeon: Renford Dills, MD;  Location: [facility];  Service: Cardiovascular;  Laterality: Right;   PILONIDAL CYST EXCISION  1976    Social History Social History   Tobacco Use   Smoking status: Never   Smokeless tobacco: Never  Substance Use Topics   Alcohol use: Not Currently   Drug use: Never    Family History Family History  Problem Relation Age of Onset   Colon cancer Mother    Heart disease Father    Hyperlipidemia Father    Hypertension Father    Lung cancer Father    Hypertension Sister    Hyperlipidemia Sister    Colon cancer Sister    Arthritis/Rheumatoid Sister    Colon cancer Maternal Grandfather    Heart disease Paternal Grandmother    Heart attack Paternal Grandmother     Allergies  Allergen Reactions   Silicone Rash     REVIEW OF SYSTEMS (Negative unless checked)  Constitutional: [] Weight loss  [] Fever  []   Chills Cardiac: [] Chest pain   [] Chest pressure   [] Palpitations   [] Shortness of breath when laying flat   [] Shortness of breath with exertion. Vascular:  [] Pain in legs with walking   [x] Pain in legs at rest  [] History of DVT   [] Phlebitis   [x] Swelling in legs   [] Varicose veins   [] Non-healing ulcers Pulmonary:   [] Uses home oxygen   [] Productive cough   [] Hemoptysis   [] Wheeze  [] COPD   [] Asthma Neurologic:  [] Dizziness   [] Seizures   [] History of stroke   [] History of TIA  [] Aphasia   [] Vissual changes   [] Weakness or numbness in arm    [] Weakness or numbness in leg Musculoskeletal:   [] Joint swelling   [] Joint pain   [] Low back pain Hematologic:  [] Easy bruising  [] Easy bleeding   [] Hypercoagulable state   [] Anemic Gastrointestinal:  [] Diarrhea   [] Vomiting  [] Gastroesophageal reflux/heartburn   [] Difficulty swallowing. Genitourinary:  [] Chronic kidney disease   [] Difficult urination  [] Frequent urination   [] Blood in urine Skin:  [] Rashes   [] Ulcers  Psychological:  [] History of anxiety   []  History of major depression.  Physical Examination  There were no vitals filed for this visit. There is no height or weight on file to calculate BMI. Gen: WD/WN, NAD Head: [place]/AT, No temporalis wasting.  Ear/Nose/Throat: Hearing grossly intact, nares w/o erythema or drainage, pinna without lesions Eyes: PER, EOMI, sclera nonicteric.  Neck: Supple, no gross masses.  No JVD.  Pulmonary:  Good air movement, no audible wheezing, no use of accessory muscles.  Cardiac: RRR, precordium not hyperdynamic. Vascular:  scattered varicosities present bilaterally.  Very mild venous stasis changes to the legs bilaterally.  Trace soft pitting edema  Vessel Right Left  Radial Palpable Palpable  Gastrointestinal: soft, non-distended. No guarding/no peritoneal signs.  Musculoskeletal: M/S 5/5 throughout.  No deformity.  Neurologic: CN 2-12 intact. Pain and light touch intact in extremities.  Symmetrical.  Speech is fluent. Motor exam as listed above. Psychiatric: Judgment intact, Mood & affect appropriate for pt's clinical situation. Dermatologic: Venous rashes no ulcers noted.  No changes consistent with cellulitis. Lymph : No lichenification or skin changes of chronic lymphedema.  CBC Lab Results  Component Value Date   WBC 5.5 05/15/2022   HGB 12.8 05/15/2022   HCT 39.9 05/15/2022   MCV 96.4 05/15/2022   PLT 256 05/15/2022    BMET    Component Value Date/Time   NA 141 05/15/2022 1549   K 4.1 05/15/2022 1549   CL 109 05/15/2022 1549    CO2 27 05/15/2022 1549   GLUCOSE 104 (H) 05/15/2022 1549   BUN 12 05/15/2022 1549   CREATININE 0.78 05/15/2022 1549   CALCIUM 9.2 05/15/2022 1549   GFRNONAA >60 05/15/2022 1549   CrCl cannot be calculated (Patient's most recent lab result is older than the maximum 21 days allowed.).  COAG Lab Results  Component Value Date   INR 1.0 05/15/2022   INR 1.0 03/14/2022    Radiology No results found.   Assessment/Plan 1. Acute deep vein thrombosis (DVT) of proximal vein of right lower extremity (HCC) Recommend:   No surgery or intervention at this point in time.  IVC filter is not indicated at present.  Patient's previous duplex ultrasound of the venous system shows essentially normal veins.  The patient has completed 6 months anticoagulation   Elevation was stressed, such as the use of a recliner.  I have discussed  DVT and post phlebitic changes such as swelling  and why it  causes symptoms such as pain.  The patient should wear graduated compression stockings.  The compression should be worn on a daily basis. The patient should wear the stockings first thing in the morning and removing them in the evening. The patient should not to sleep in the stockings.  In addition, behavioral modification including elevation during the day and avoidance of prolonged dependency will be initiated.    The patient will stop anticoagulation for now as there have not been any problems or complications at this point.  I did discuss half dose therapy, however at this time the patient wishes to just stop anticoagulation.  2. Elevated blood-pressure reading without diagnosis of hypertension Continue antihypertensive medications as already ordered, these medications have been reviewed and there are no changes at this time.    Levora Dredge, MD  09/08/2022 3:01 PM

## 2022-09-10 ENCOUNTER — Ambulatory Visit (INDEPENDENT_AMBULATORY_CARE_PROVIDER_SITE_OTHER): Payer: Medicare HMO | Admitting: Vascular Surgery

## 2022-09-10 ENCOUNTER — Encounter (INDEPENDENT_AMBULATORY_CARE_PROVIDER_SITE_OTHER): Payer: Self-pay | Admitting: Vascular Surgery

## 2022-09-10 VITALS — BP 159/82 | HR 67 | Ht 72.0 in | Wt 225.0 lb

## 2022-09-10 DIAGNOSIS — I824Y1 Acute embolism and thrombosis of unspecified deep veins of right proximal lower extremity: Secondary | ICD-10-CM

## 2022-09-10 DIAGNOSIS — R030 Elevated blood-pressure reading, without diagnosis of hypertension: Secondary | ICD-10-CM | POA: Diagnosis not present

## 2023-10-25 IMAGING — US US EXTREM LOW VENOUS*R*
1 series · 13 of 24 positions shown · non-contrast
Comparison: None Available.

CLINICAL DATA: 65-year-old female with right leg swelling.

EXAM:
RIGHT LOWER EXTREMITY VENOUS DOPPLER ULTRASOUND
TECHNIQUE: Gray-scale sonography with graded compression, as well as color
Doppler and duplex ultrasound were performed to evaluate the right
lower extremity deep venous systems from the level of the common
femoral vein and including the common femoral, femoral, profunda
femoral, popliteal and calf veins including the posterior tibial,
peroneal and gastrocnemius veins when visible. Spectral Doppler was
utilized to evaluate flow at rest and with distal augmentation
maneuvers in the common femoral, femoral and popliteal veins. The
contralateral common femoral vein was also evaluated for comparison.

[Series 1: us venous img lower uni right (dvt) · portal-venous · 13 of 33 slices shown]
[im 1/33]
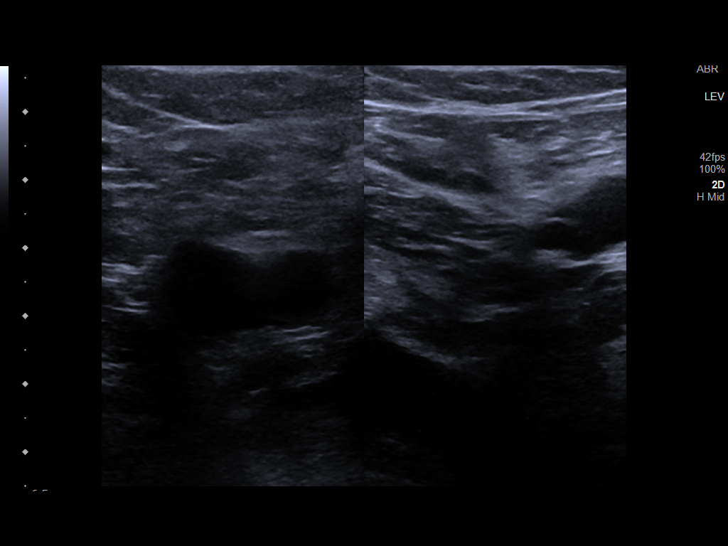
[im 3/33]
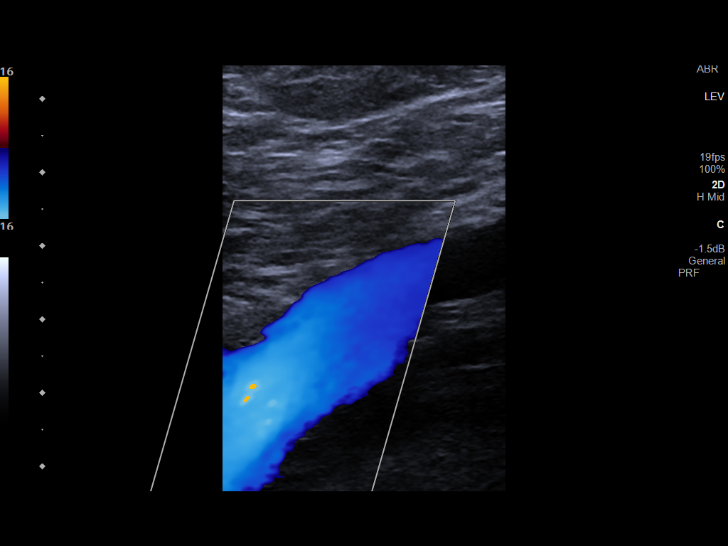
[im 6/33]
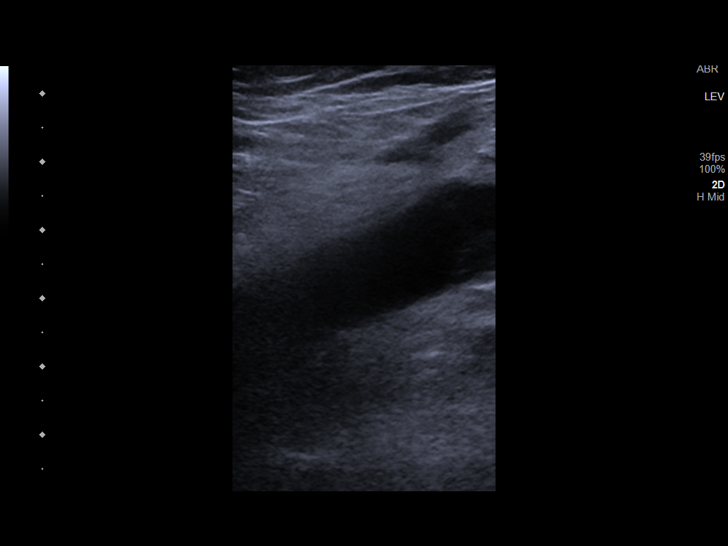
[im 9/33]
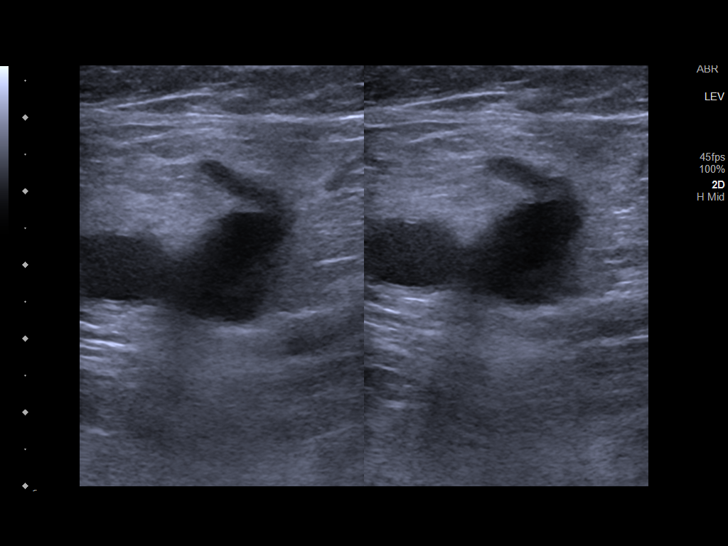
[im 12/33]
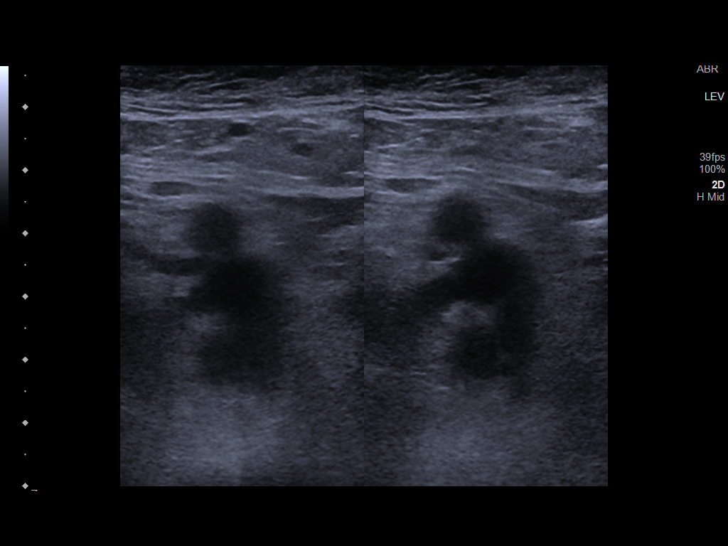
[im 14/33]
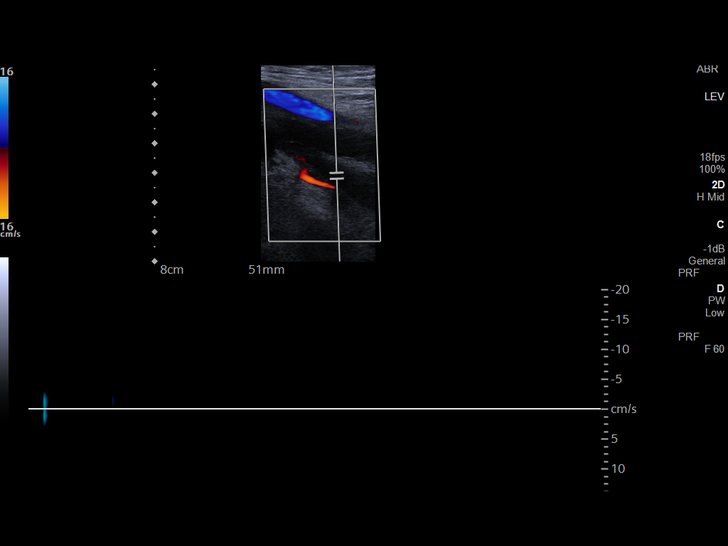
[im 17/33]
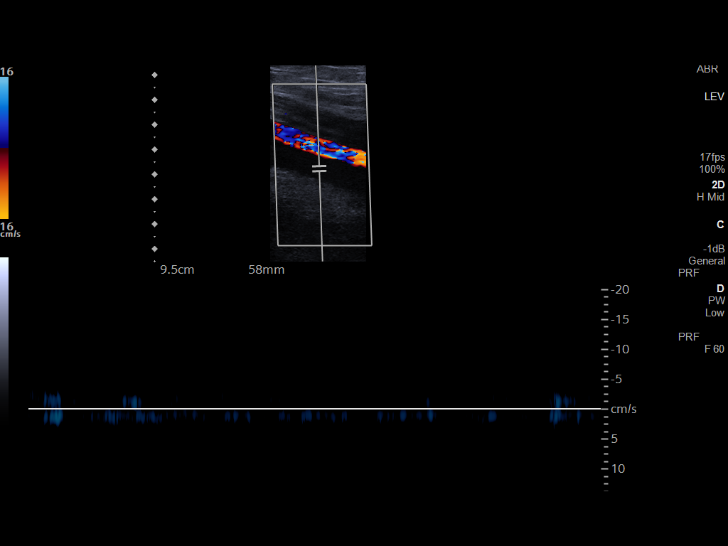
[im 19/33]
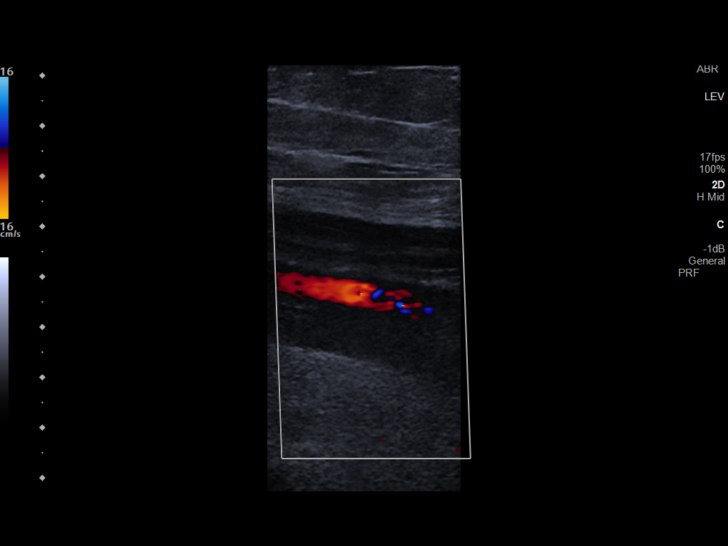
[im 21/33]
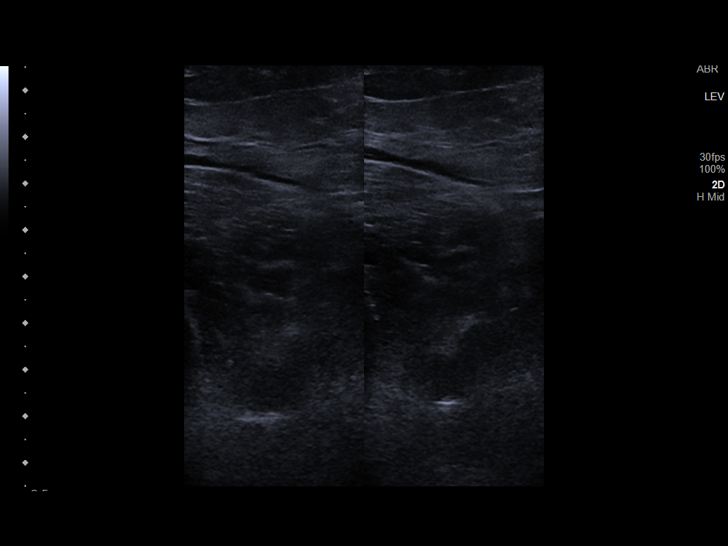
[im 24/33]
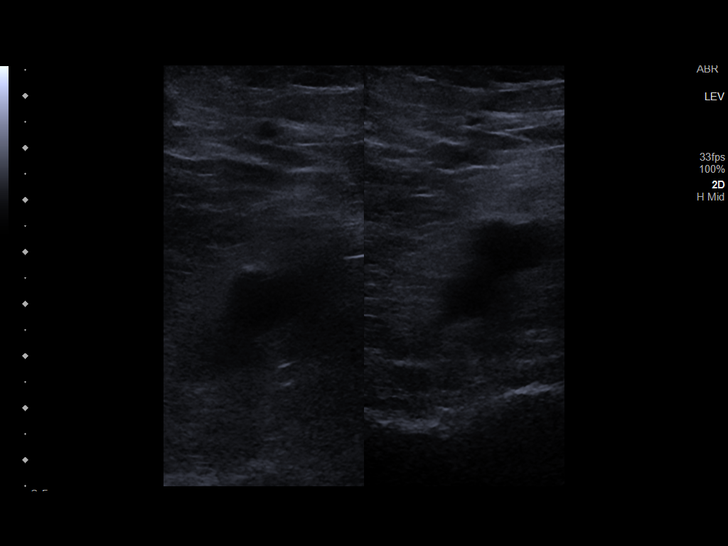
[im 27/33]
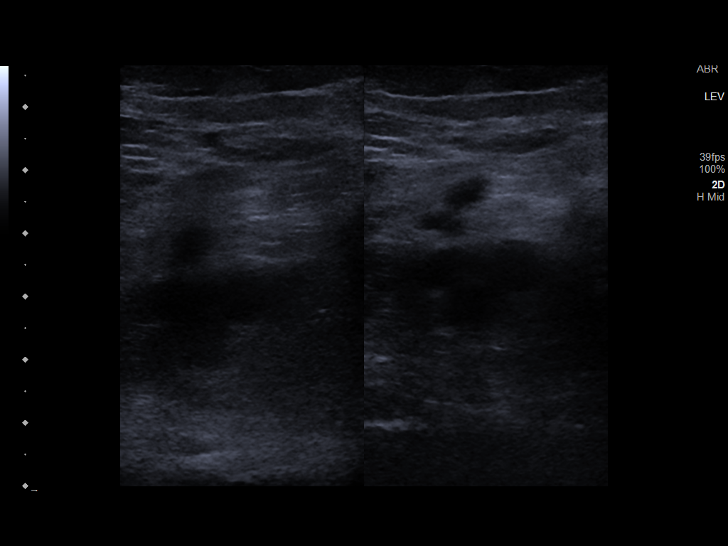
[im 30/33]
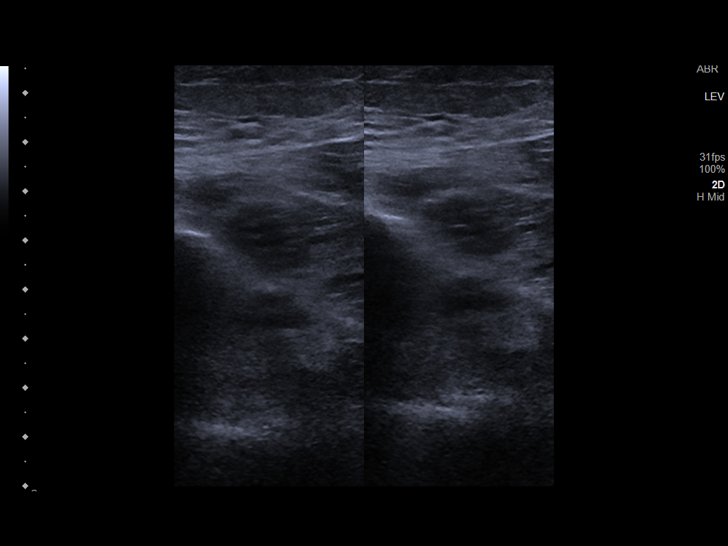
[im 33/33]
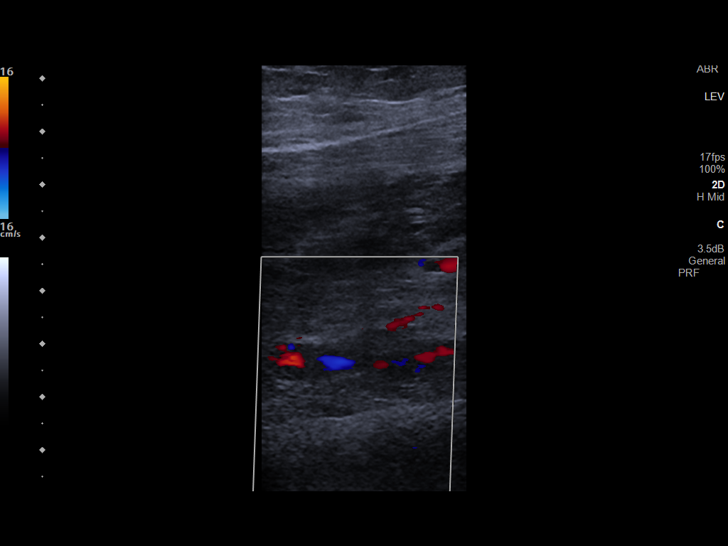

[13 of 24 positions shown; findings below may reference images not displayed]

FINDINGS: RIGHT LOWER EXTREMITY

Common Femoral Vein: Hypoechoic, expansile, noncompressible,
occlusive thrombus throughout.

Central Greater Saphenous Vein: Hypoechoic, expansile,
noncompressible, occlusive thrombus throughout.

Central Profunda Femoral Vein: Hypoechoic, expansile,
noncompressible, occlusive thrombus throughout.

Femoral Vein: Hypoechoic, expansile, noncompressible, occlusive
thrombus throughout.

Popliteal Vein: Hypoechoic, expansile, noncompressible, occlusive
thrombus throughout.

Calf Veins: Hypoechoic, expansile, noncompressible, occlusive
thrombus throughout.

Other Findings:  None.

LEFT LOWER EXTREMITY

Common Femoral Vein: No evidence of thrombus. Normal
compressibility, respiratory phasicity and response to augmentation.
IMPRESSION: Acute appearing occlusive thrombus extending from the calf veins
through the visualized femoral vein. Extent of possible iliocaval
thrombus burden is not evaluated on this study.

Recommend CT the abdomen and pelvis for further characterization of
possible iliocaval thrombus.

These results were called by telephone at the time of interpretation
on 03/14/2022 at [DATE] to provider JESSELYN INIYAN , who verbally
acknowledged these results.

## 2024-01-02 ENCOUNTER — Telehealth (INDEPENDENT_AMBULATORY_CARE_PROVIDER_SITE_OTHER): Payer: Self-pay

## 2024-01-02 NOTE — Telephone Encounter (Signed)
 Per Gail Roberts- Being she has a  DVT history the benefits could be good, but it's best to discuss those with her OB to decide which is the best outcome for her.

## 2024-01-02 NOTE — Telephone Encounter (Signed)
 Patient called about going to the OB to get estrogen for vaginal dryness. She would like to know is it okay to get?   Please advise

## 2024-01-03 NOTE — Telephone Encounter (Signed)
 Message given

## 2024-01-09 ENCOUNTER — Other Ambulatory Visit (HOSPITAL_COMMUNITY)
Admission: RE | Admit: 2024-01-09 | Discharge: 2024-01-09 | Disposition: A | Discharge status: Home / Self Care | Source: Ambulatory Visit | Attending: Obstetrics and Gynecology | Admitting: Obstetrics and Gynecology

## 2024-01-09 ENCOUNTER — Ambulatory Visit: Admitting: Obstetrics and Gynecology

## 2024-01-09 ENCOUNTER — Encounter: Payer: Self-pay | Admitting: Obstetrics and Gynecology

## 2024-01-09 VITALS — BP 162/81 | HR 69 | Ht 70.7 in | Wt 224.0 lb

## 2024-01-09 DIAGNOSIS — N941 Unspecified dyspareunia: Secondary | ICD-10-CM

## 2024-01-09 DIAGNOSIS — N952 Postmenopausal atrophic vaginitis: Secondary | ICD-10-CM | POA: Insufficient documentation

## 2024-01-09 DIAGNOSIS — Z124 Encounter for screening for malignant neoplasm of cervix: Secondary | ICD-10-CM | POA: Insufficient documentation

## 2024-01-09 DIAGNOSIS — R350 Frequency of micturition: Secondary | ICD-10-CM

## 2024-01-09 LAB — POCT URINALYSIS DIPSTICK
Bilirubin, UA: NEGATIVE
Blood, UA: NEGATIVE
Glucose, UA: NEGATIVE
Ketones, UA: NEGATIVE
Leukocytes, UA: NEGATIVE
Nitrite, UA: NEGATIVE
Protein, UA: NEGATIVE
Spec Grav, UA: 1.015 (ref 1.010–1.025)
Urobilinogen, UA: 0.2 U/dL
pH, UA: 7 (ref 5.0–8.0)

## 2024-01-09 MED ORDER — ESTRADIOL 0.1 MG/GM VA CREA: 0.5000 g | TOPICAL_CREAM | VAGINAL | 11 refills | Status: AC

## 2024-01-09 NOTE — Patient Instructions (Addendum)
 Please re-start vaginal estrogen doing 1g a night for 7 days then reduce to 0.5g for 7 days. After 2 weeks do the estrogen twice weekly for maintenance.   Try a silicon based lubrication for intercourse.

## 2024-01-09 NOTE — Progress Notes (Signed)
 [facility] New Patient Evaluation and Consultation  Referring Provider: Cory Roughen, PA-C PCP: Cherie Ouch Date of Service: 01/09/2024  SUBJECTIVE Chief Complaint: New Patient (Initial Visit) Gail Roberts is a 68 y.o. female is here for vaginal dryness and pain)  History of Present Illness: Gail Roberts is a 68 y.o. White or Caucasian female seen in consultation at the request of Dr. Duaine Dredge for evaluation of vaginal atrophy and painful intercourse.    Review of records significant for: Has previously tried estrogen cream and has been using lubrication with intercourse previously. Has not tried intercourse since December due to pain.   Urinary Symptoms: Does not leak urine.   Day time voids 4-6.  Nocturia: 0-1 times per night to void. Voiding dysfunction:  empties bladder well.  Patient does not use a catheter to empty bladder.  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   Denies history of blood in urine, kidney or bladder stones, pyelonephritis, bladder cancer, and kidney cancer No results found for the last 90 days.   Pelvic Organ Prolapse Symptoms:                  Patient Denies a feeling of a bulge the vaginal area.   Bowel Symptom: Bowel movements: 1 time(s) per day Stool consistency: soft  Straining: no.  Splinting: no.  Incomplete evacuation: no.  Patient Denies accidental bowel leakage / fecal incontinence Bowel regimen: none Last colonoscopy: Date 2019, Results polyps [facility] Colonoscopy          Current Care Gaps     Colonoscopy (Every 10 Years) Never done   No completion history exists for this topic.                 Sexual Function Sexually active: yes.  Sexual orientation: Straight Pain with sex: Yes, at the vaginal opening, has discomfort due to dryness  Pelvic Pain Denies pelvic pain   Past Medical  History:  Past Medical History:  Diagnosis Date   Arthritis    toe   DVT (deep venous thrombosis) (HCC)      Past Surgical History:   Past Surgical History:  Procedure Laterality Date   ABDOMINAL HYSTERECTOMY     ARTHRODESIS METATARSALPHALANGEAL JOINT (MTPJ) Right 02/15/2022   Procedure: ARTHRODESIS METATARSALPHALANGEAL JOINT (MTPJ) - FIRST;  Surgeon: Rosetta Posner, DPM;  Location: [facility];  Service: Podiatry;  Laterality: Right;  Anesthesia: Choice - pop/saph exparil   BREAST EXCISIONAL BIOPSY Right    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 Menopausal: Yes, at age 34 Contraception: Hyst. Last pap smear was 2024.  Any history of abnormal pap smears: no. HM PAP   This patient has no relevant Health Maintenance data.     Medications: Patient has a current medication list which includes the following prescription(s): acetaminophen, vitamin d3, cranberry, estradiol, ascorbic acid, atorvastatin, and loratadine.   Allergies: Patient is allergic to silicone.   Social History:  Social History   Tobacco Use   Smoking status: Never   Smokeless tobacco: Never  Vaping Use  Vaping status: Never Used  Substance Use Topics   Alcohol use: Not Currently   Drug use: Never    Relationship status: married Patient lives with husband.   Patient is not employed . Regular exercise: Yes: walking and gym History of abuse: No  Family History:   Family History  Problem Relation Age of Onset   Colon cancer Mother    Heart disease Father    Hyperlipidemia Father    Hypertension Father    Lung cancer Father    Hypertension Sister    Hyperlipidemia Sister    Colon  cancer Sister    Arthritis/Rheumatoid Sister    Colon cancer Maternal Grandfather    Heart disease Paternal Grandmother    Heart attack Paternal Grandmother      Review of Systems: Review of Systems  Constitutional:  Negative for chills and fever.  Respiratory:  Negative for cough and shortness of breath.   Cardiovascular:  Negative for chest pain and palpitations.  Gastrointestinal:  Negative for abdominal pain, blood in stool, constipation and diarrhea.  Skin:  Negative for rash.  Neurological:  Negative for weakness.  Endo/Heme/Allergies:  Bruises/bleeds easily.       +Hot Flashes  Psychiatric/Behavioral:  Negative for depression and suicidal ideas.      OBJECTIVE Physical Exam: Vitals:   01/09/24 1428 01/09/24 1514  BP: (!) 175/82 (!) 162/81  Pulse: 76 69  Weight: 224 lb (101.6 kg)   Height: 5' 10.7" (1.796 m)     Physical Exam Vitals reviewed. Exam conducted with a chaperone present.  Constitutional:      Appearance: Normal appearance.  Pulmonary:     Effort: Pulmonary effort is normal.  Abdominal:     Palpations: Abdomen is soft.  Neurological:     General: No focal deficit present.     Mental Status: She is alert and oriented to person, place, and time.  Psychiatric:        Mood and Affect: Mood normal.        Behavior: Behavior normal. Behavior is cooperative.        Thought Content: Thought content normal.      GU / Detailed Urogynecologic Evaluation:  Pelvic Exam: Normal external female genitalia; Bartholin's and Skene's glands normal in appearance; urethral meatus normal in appearance, no urethral masses or discharge.   CST: negative   s/p hysterectomy: Speculum exam reveals normal vaginal mucosa with  atrophy and normal vaginal cuff.  Adnexa normal adnexa.  Pap done at patient request due to discomfort.    Pelvic floor strength II/V  Pelvic floor musculature: Right levator non-tender, Right obturator non-tender, Left levator non-tender, Left  obturator non-tender  POP-Q:   POP-Q  -2.5                                            Aa   -2.5                                           Ba  -8  C   3                                            Gh  6                                            Pb  8                                            tvl   -2                                            Ap  -2                                            Bp                                                 D      Rectal Exam:  Normal external exam  Post-Void Residual (PVR) by Bladder Scan: In order to evaluate bladder emptying, we discussed obtaining a postvoid residual and patient agreed to this procedure.  Procedure: The ultrasound unit was placed on the patient's abdomen in the suprapubic region after the patient had voided.    Post Void Residual - 01/09/24 1442       Post Void Residual   Post Void Residual 57 mL              Laboratory Results: Lab Results  Component Value Date   COLORU yellow 01/09/2024   CLARITYU clear 01/09/2024   GLUCOSEUR Negative 01/09/2024   BILIRUBINUR negative 01/09/2024   KETONESU negative 01/09/2024   SPECGRAV 1.015 01/09/2024   RBCUR negative 01/09/2024   PHUR 7.0 01/09/2024   PROTEINUR Negative 01/09/2024   UROBILINOGEN 0.2 01/09/2024   LEUKOCYTESUR Negative 01/09/2024    Lab Results  Component Value Date   CREATININE 0.78 05/15/2022   CREATININE 0.55 03/15/2022   CREATININE 0.67 03/14/2022    No results found for: "HGBA1C"  Lab Results  Component Value Date   HGB 12.8 05/15/2022     ASSESSMENT AND PLAN Ms. Gail Roberts is a 68 y.o. with:  1. Vaginal atrophy   2. Urinary frequency   3. Dyspareunia in female    Patient has significant vaginal atrophy. The vaginal tissues were red, easily bled, and were tender during exam. There is no abnormal discharge. Patient to restart vaginal estrogen. For loading dose for the first 7  days she can do 1g nightly, then the next 7 days do 0.5g nightly and after that do 0.5 g twice weekly. Pap smear was done today at patient request due to vaginal discomfort with pelvic exams. No cervix to pap, so just swabbed with brush at cuff.  We  discussed using a silicon blend or base lubrication for intercourse due to the vaginal tissues absorbing the fluid and it becoming sticky/tacky with water based lubricants. Patient given sampels of uberlube.  Not bothersome to patient.   Patient to follow up in 3 months or sooner if needed.   Selmer Dominion, NP

## 2024-01-13 ENCOUNTER — Encounter: Payer: Self-pay | Admitting: Obstetrics and Gynecology

## 2024-01-13 LAB — CYTOLOGY - PAP: Diagnosis: NEGATIVE

## 2024-01-21 ENCOUNTER — Encounter: Payer: Self-pay | Admitting: Obstetrics and Gynecology

## 2024-02-02 ENCOUNTER — Encounter: Payer: Self-pay | Admitting: Obstetrics and Gynecology

## 2024-02-11 ENCOUNTER — Ambulatory Visit: Admitting: Obstetrics and Gynecology

## 2024-02-25 ENCOUNTER — Encounter (INDEPENDENT_AMBULATORY_CARE_PROVIDER_SITE_OTHER): Payer: Self-pay

## 2024-03-05 ENCOUNTER — Ambulatory Visit: Payer: Medicare HMO | Admitting: Obstetrics and Gynecology

## 2024-03-23 ENCOUNTER — Other Ambulatory Visit (HOSPITAL_COMMUNITY): Payer: Self-pay

## 2024-03-24 ENCOUNTER — Other Ambulatory Visit (HOSPITAL_COMMUNITY): Payer: Self-pay

## 2024-03-24 ENCOUNTER — Other Ambulatory Visit: Payer: Self-pay

## 2024-03-24 MED ORDER — ATORVASTATIN CALCIUM 80 MG PO TABS
80.0000 mg | ORAL_TABLET | Freq: Every day | ORAL | 3 refills | Status: DC
  Filled 2024-03-24: qty 90, 90d supply, fill #0
  Filled 2024-06-17: qty 90, 90d supply, fill #1

## 2024-04-13 ENCOUNTER — Encounter: Payer: Self-pay | Admitting: Obstetrics and Gynecology

## 2024-04-13 ENCOUNTER — Ambulatory Visit: Admitting: Obstetrics and Gynecology

## 2024-04-13 VITALS — BP 167/89 | HR 71

## 2024-04-13 DIAGNOSIS — R238 Other skin changes: Secondary | ICD-10-CM

## 2024-04-13 DIAGNOSIS — N952 Postmenopausal atrophic vaginitis: Secondary | ICD-10-CM | POA: Diagnosis not present

## 2024-04-13 MED ORDER — CLOBETASOL PROPIONATE E 0.05 % EX CREA: 1.0000 | TOPICAL_CREAM | Freq: Every evening | CUTANEOUS | 2 refills | Status: AC

## 2024-04-13 NOTE — Progress Notes (Signed)
 [facility] Return Visit  SUBJECTIVE  History of Present Illness: Rosalea Withrow is a 68 y.o. female seen in follow-up for vaginal atrophy and perineal irritation. Plan at last visit was start estrogen cream and use silicon based lubrication.  Patient reports she has tried to have intercourse with her husband but is still having bleeding after intercourse and they felt it was too much to continue to try. She does report improvement in her vaginal atrophy symptoms and will continue the estrogen cream.      Past Medical History: Patient  has a past medical history of Arthritis and DVT (deep venous thrombosis) (HCC).   Past Surgical History: She  has a past surgical history that includes Abdominal hysterectomy; Pilonidal cyst excision (1976); Breast excisional biopsy (Right); Patent ductus arterious repair; Arthrodesis metatarsalphalangeal joint (mtpj) (Right, 02/15/2022); and PERIPHERAL VASCULAR THROMBECTOMY (Right, 03/16/2022).   Medications: She has a current medication list which includes the following prescription(s): acetaminophen , atorvastatin , atorvastatin , vitamin d3, clobetasol  propionate e, cranberry, estradiol , loratadine, and ascorbic acid.   Allergies: Patient is allergic to silicone.   Social History: Patient  reports that she has never smoked. She has never used smokeless tobacco. She reports that she does not currently use alcohol. She reports that she does not use drugs.     OBJECTIVE     Physical Exam: Vitals:   04/13/24 1436  BP: (!) 167/89  Pulse: 71   Gen: No apparent distress, A&O x 3.  Detailed Urogynecologic Evaluation:  Posterior fourchette is still red and irritated, but is looking better than prior exam. Patient has silver skin irritation around the anus and perineal area.    ASSESSMENT AND PLAN    Ms. Ager is a 68 y.o. with:  1. Skin irritation   2. Vaginal atrophy    1. Clobetasol  cream prescribed for patient to use for  possible lichen's skin irritation.  2. Patient to continue estrogen cream x2 weekly and then can use vitamin e, aloe, or hyaluronic acid on nights not using estrogen.   Patient to follow up in 1 year or sooner if needed.   Ingris Pasquarella G Chaundra Abreu, NP

## 2024-04-13 NOTE — Patient Instructions (Addendum)
   You can consider using something with Vitamin E or Hyaluronic acid on the days you are not using the estrogen cream. You can let me know if the area is bothering you more.  Use clobetasol  nightly for the firs 3-5 nights then use it twice a week as needed.

## 2024-06-17 ENCOUNTER — Other Ambulatory Visit (HOSPITAL_COMMUNITY): Payer: Self-pay

## 2024-09-14 ENCOUNTER — Other Ambulatory Visit (HOSPITAL_COMMUNITY): Payer: Self-pay

## 2024-09-15 ENCOUNTER — Other Ambulatory Visit (HOSPITAL_COMMUNITY): Payer: Self-pay

## 2024-09-15 MED ORDER — ATORVASTATIN CALCIUM 80 MG PO TABS
80.0000 mg | ORAL_TABLET | Freq: Every day | ORAL | 3 refills | Status: AC
  Filled 2024-09-15: qty 90, 90d supply, fill #0
